# Patient Record
Sex: Female | Born: 1985 | Race: White | Hispanic: No | Marital: Single | Smoking: Current every day smoker
Health system: Southern US, Community
[De-identification: ages and names within clinical notes are randomized; demographics above are authoritative.]

## PROBLEM LIST (undated history)

## (undated) ENCOUNTER — Inpatient Hospital Stay (HOSPITAL_COMMUNITY): Payer: Self-pay

## (undated) DIAGNOSIS — F32A Depression, unspecified: Secondary | ICD-10-CM

## (undated) DIAGNOSIS — D219 Benign neoplasm of connective and other soft tissue, unspecified: Secondary | ICD-10-CM

## (undated) DIAGNOSIS — R12 Heartburn: Secondary | ICD-10-CM

## (undated) DIAGNOSIS — M1990 Unspecified osteoarthritis, unspecified site: Secondary | ICD-10-CM

## (undated) DIAGNOSIS — Z9889 Other specified postprocedural states: Secondary | ICD-10-CM

## (undated) DIAGNOSIS — F329 Major depressive disorder, single episode, unspecified: Secondary | ICD-10-CM

## (undated) DIAGNOSIS — R112 Nausea with vomiting, unspecified: Secondary | ICD-10-CM

## (undated) DIAGNOSIS — O26899 Other specified pregnancy related conditions, unspecified trimester: Secondary | ICD-10-CM

## (undated) HISTORY — PX: WISDOM TOOTH EXTRACTION: SHX21

---
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
## 2005-01-27 ENCOUNTER — Ambulatory Visit: Payer: Self-pay | Admitting: Family Medicine

## 2007-03-08 ENCOUNTER — Emergency Department (HOSPITAL_COMMUNITY): Admission: EM | Admit: 2007-03-08 | Discharge: 2007-03-08 | Payer: Self-pay | Admitting: Emergency Medicine

## 2007-04-02 ENCOUNTER — Emergency Department (HOSPITAL_COMMUNITY): Admission: EM | Admit: 2007-04-02 | Discharge: 2007-04-02 | Payer: Self-pay | Admitting: Emergency Medicine

## 2007-04-10 ENCOUNTER — Encounter: Admission: RE | Admit: 2007-04-10 | Discharge: 2007-04-10 | Payer: Self-pay | Admitting: Emergency Medicine

## 2008-01-23 ENCOUNTER — Inpatient Hospital Stay (HOSPITAL_COMMUNITY): Admission: AD | Admit: 2008-01-23 | Discharge: 2008-01-23 | Payer: Self-pay | Admitting: Obstetrics & Gynecology

## 2008-07-19 ENCOUNTER — Encounter (INDEPENDENT_AMBULATORY_CARE_PROVIDER_SITE_OTHER): Payer: Self-pay | Admitting: Obstetrics and Gynecology

## 2008-07-19 ENCOUNTER — Inpatient Hospital Stay (HOSPITAL_COMMUNITY): Admission: AD | Admit: 2008-07-19 | Discharge: 2008-07-22 | Payer: Self-pay | Admitting: Obstetrics and Gynecology

## 2009-03-09 IMAGING — CR DG CHEST 2V
2 series · 2 of 2 positions shown · non-contrast
Comparison: None

CLINICAL DATA: Chest pain.  
 CHEST - 2 VIEW:

[w chest pa]
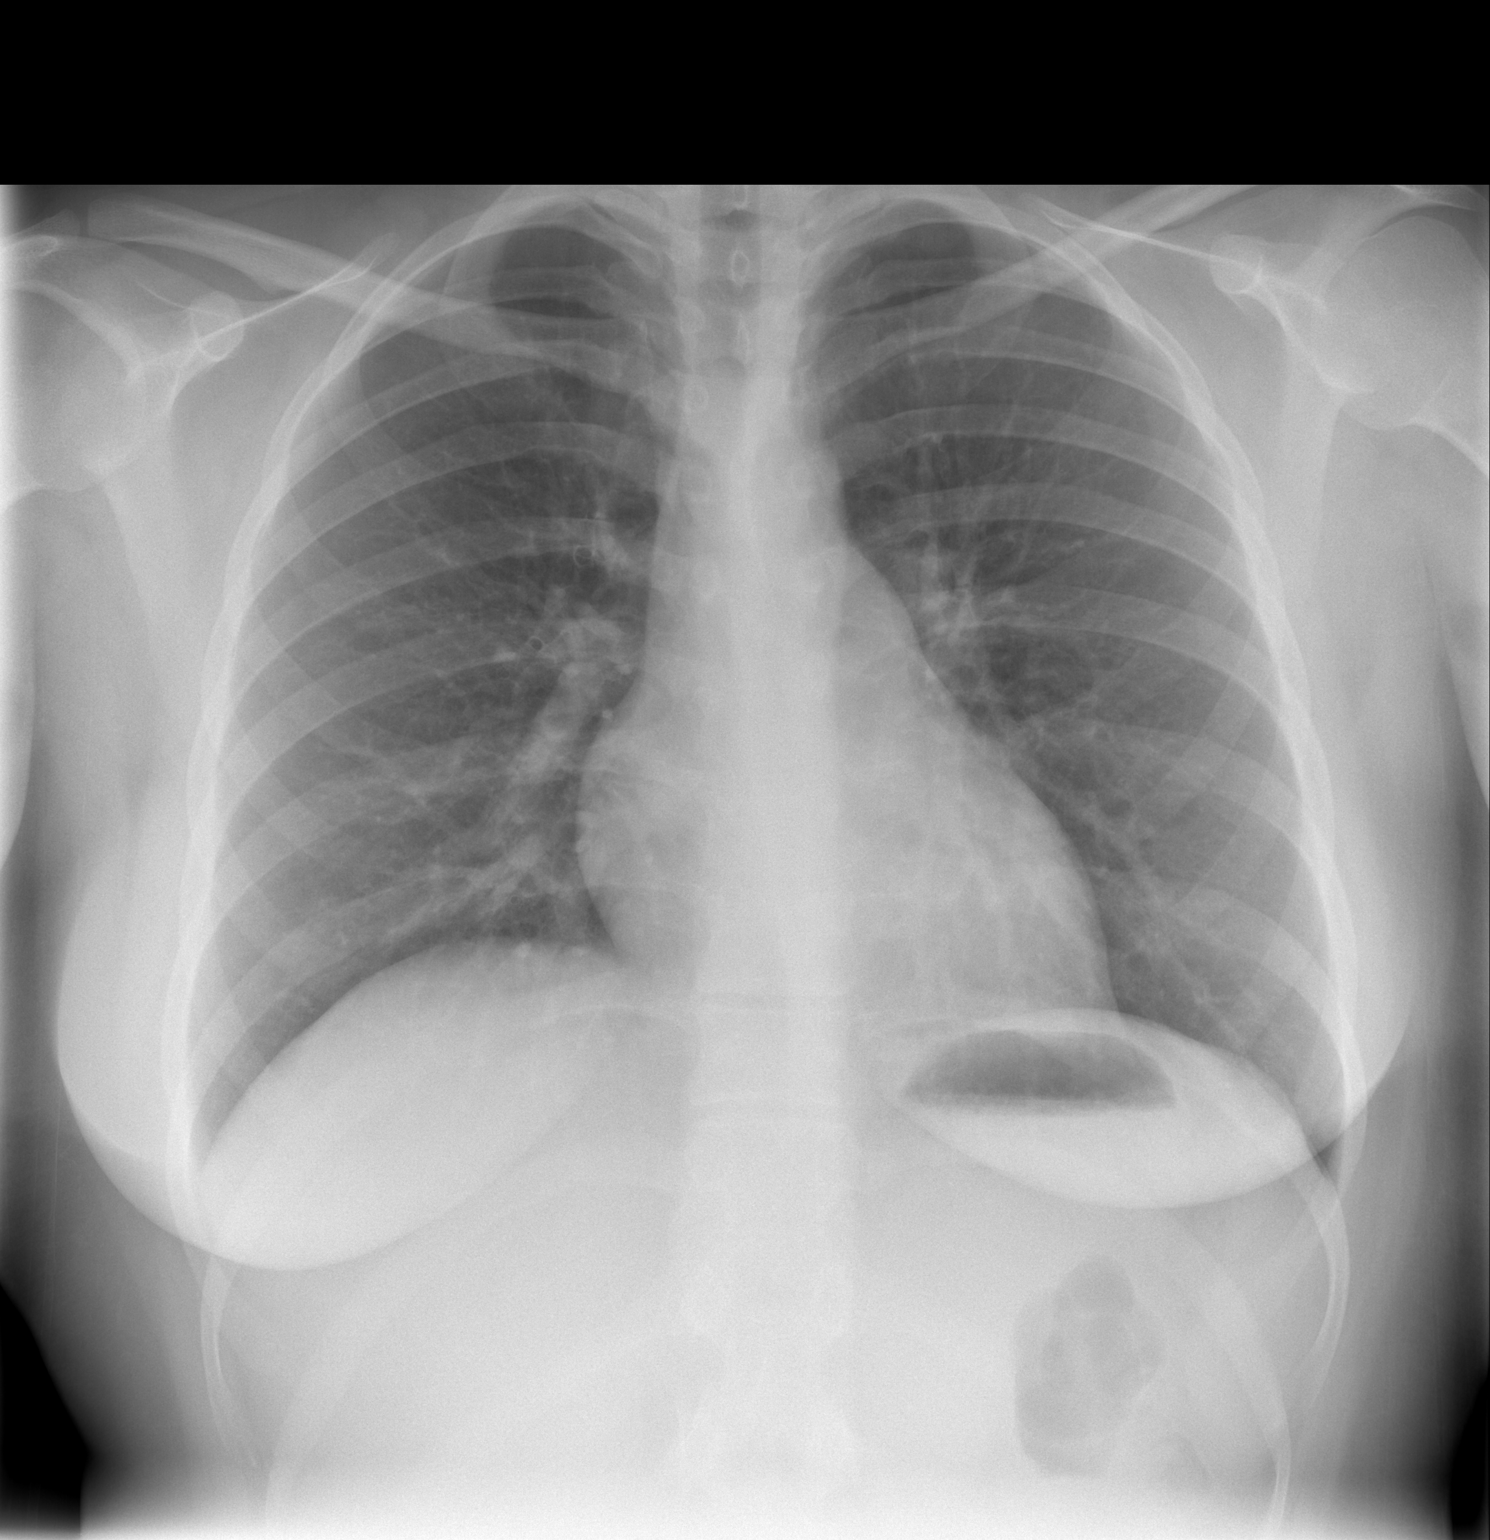

[w chest lat]
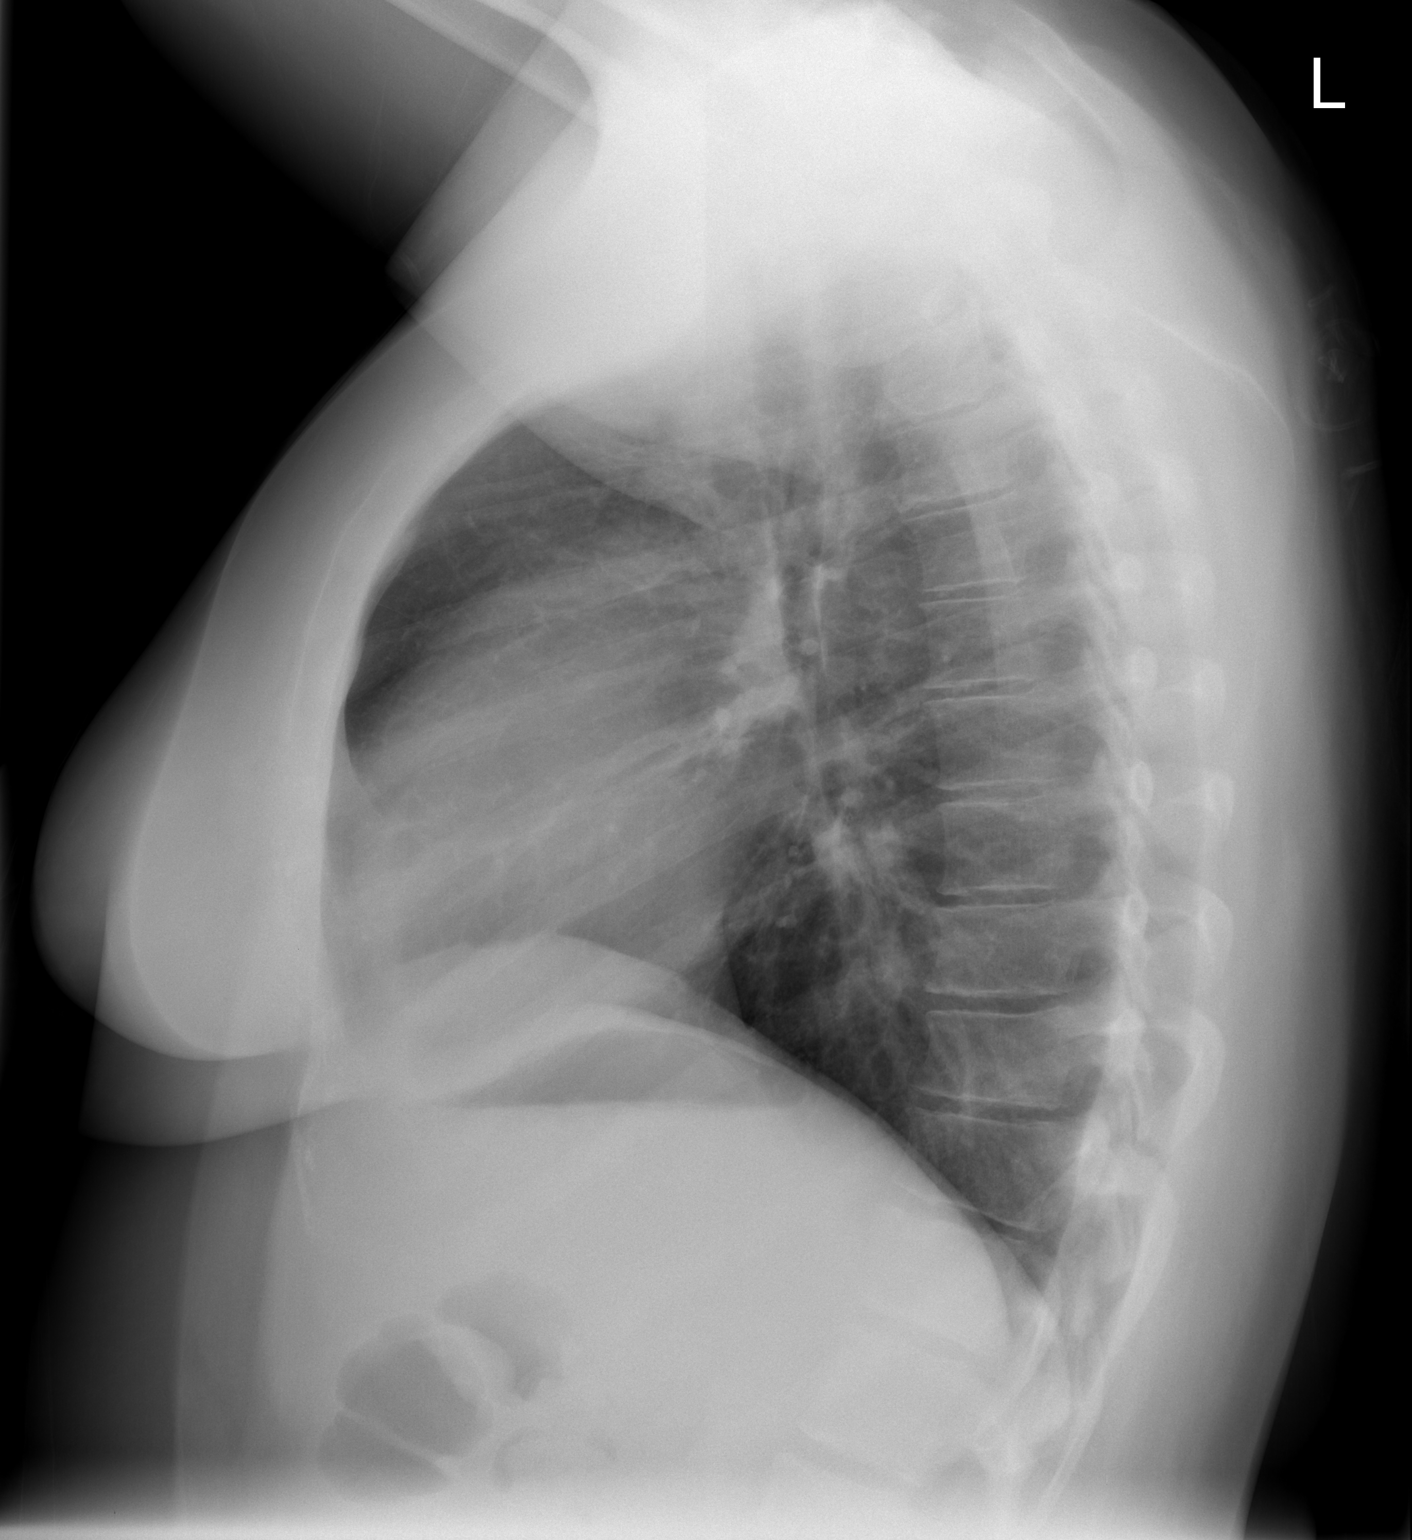

[2 of 2 positions shown; findings below may reference images not displayed]

FINDINGS: The heart size and mediastinal contours are within normal limits.  Both lungs are clear.  The visualized skeletal structures are unremarkable.
IMPRESSION: No active cardiopulmonary disease.

## 2010-04-04 ENCOUNTER — Emergency Department (HOSPITAL_COMMUNITY): Admission: EM | Admit: 2010-04-04 | Discharge: 2010-04-05 | Payer: Self-pay | Admitting: Emergency Medicine

## 2010-10-03 ENCOUNTER — Encounter: Payer: Self-pay | Admitting: Emergency Medicine

## 2011-01-25 NOTE — Op Note (Signed)
[name], Mcmahon            ACCOUNT NO.:  [id number]   MEDICAL RECORD NO.:  [id number]          PATIENT TYPE:  INP   LOCATION:  9119                          FACILITY:  [facility]   PHYSICIAN:  Kendra H. Tenny Craw, MD     DATE OF BIRTH:  05-Jan-1986   DATE OF PROCEDURE:  07/19/2008  DATE OF DISCHARGE:                               OPERATIVE REPORT   PREOPERATIVE DIAGNOSES:  1. A 40 and 6-week intrauterine pregnancy.  2. Failure to progress.  3. Meconium-stained fluid.  4. Deep variables.   POSTOPERATIVE DIAGNOSES:  1. A 40 and 6-week intrauterine pregnancy.  2. Failure to progress.  3. Meconium-stained fluid.  4. Deep variables.   PROCEDURE:  Primary low-transverse cesarean section.   SURGEON:  Freddrick March. Tenny Craw, MD   ASSISTANT:  None.   ANESTHESIA:  Spinal.   OPERATIVE FINDINGS:  Vigorous female infant in the vertex presentation,  weighing 7 pounds 2 ounces with Apgars of 9 at 1 and 9 at 5.  Normal-  appearing ovaries, tubes, and uterus.   SPECIMEN:  Placenta to Pathology.   ESTIMATED BLOOD LOSS:  700 mL.   COMPLICATIONS:  None.   DESCRIPTION OF PROCEDURE:  Lauren Mcmahon is a 25 year old G1, P0 who  presented at 3:00 in the morning on July 19, 2008, complaining of  contractions.  She was found to be 2 cm dilated at that time, 80%  effaced, and -2 station.  She was admitted at 8:30 in the morning.  She  had made little progress and Pitocin was initiated at 10:30.  She was 4  cm dilated, 80% effaced, and -2 station.  Amniotomy was performed.  An  intrauterine pressure catheter was placed at this time.  Thick meconium-  stained fluid was noted at the time of amniotomy.  Fetal heart tones,  however, were reactive in the 130s.  Pitocin augmentation continued;  however, at 5 o'clock, she was still 5 cm dilated and her cervix had  began to become edematous.  Additionally, she was having more consistent  variables with contractions that were becoming more severe to the 60s  lasting for approximately 1-2 minutes with slow return to baseline.  The  decision was made at this time to proceed with primary low-transverse  cesarean section for failure to progress.  Risks, benefits, and  alternatives of the procedure were discussed with the patient.  The  patient consented to the procedure and was brought to the operating  room.  An appropriate surgical level of anesthesia could not be obtained  with her epidural and a spinal was placed.  Surgical anesthesia was  quickly obtained.  She was placed in the dorsal supine position in a  leftward tilt, prepped and draped in the normal sterile fashion.  A  scalpel was used to make a Pfannenstiel skin incision, which was carried  down through the underlying layers of soft tissue to the fascia.  Fascia  was incised in the midline.  Fascial incision was extended laterally  with Mayo scissors.  Superior aspect of the fascial incision was grasped  with Kocher clamps x2 and tented up.  The underlying rectus muscle was  dissected off sharply with the electrocautery unit.  The same procedure  was repeated on the inferior aspect of the fascial incision.  Rectus  muscles were then separated in the midline.  The abdominal peritoneum  was identified, tented up, entered sharply, and the incision was  extended superiorly and inferiorly with good visualization of the  bladder.  Bladder blade was then inserted.  The vesicouterine peritoneum  was identified, tented up, entered sharply, and the incision was  extended laterally with the Metzenbaum scissors and the bladder flap was  created digitally.  Bladder blade was then inserted.  Scalpel was used  to make a low-transverse incision on the uterus, which was extended  laterally with blunt dissection and extended with bandage scissors.  The  infant's head was then identified and brought up to the uterine  incision.  A vacuum was then applied to assist with delivery of the  infant's head and  was required for approximately 3 seconds.  The infant  was then easily delivered and cried vigorously on the operative field.  The mouth was bulb suctioned.  The cord was clamped and cut.  The infant  was passed to the waiting pediatricians and the placenta was then  manually extracted.  Uterus was exteriorized, cleared of all clot and  debris.  Uterine incision was reapproximated with #1 chromic in a  running locked fashion followed by a second imbricating layer.  Uterus,  tubes, and ovaries were inspected and found to be normal.  Uterus was  returned to the abdominal cavity.  Abdominal cavity was cleared of all  clot and debris.  The abdominal peritoneum was then reapproximated with  2-0 Vicryl and the rectus muscle was reapproximated with #1 chromic in a  running fashion and the fascia was closed with 0-looped PDS.  All  sponge, lap, and needle counts were correct x2.  The patient tolerated  the procedure well and was brought to the recovery room in stable  condition following the procedure.      Freddrick March. Tenny Craw, MD  Electronically Signed     KHR/MEDQ  D:  07/19/2008  T:  07/19/2008  Job:  [id number]

## 2011-01-28 NOTE — Discharge Summary (Signed)
Lauren Mcmahon, Lauren Mcmahon            ACCOUNT NO.:  [id number]   MEDICAL RECORD NO.:  [id number]          PATIENT TYPE:  INP   LOCATION:  9135                          FACILITY:  [facility]   PHYSICIAN:  Carrington Clamp, M.D. DATE OF BIRTH:  April 06, 1986   DATE OF ADMISSION:  07/19/2008  DATE OF DISCHARGE:  07/22/2008                               DISCHARGE SUMMARY   FINAL DIAGNOSES:  Intrauterine pregnancy at 50 and 6/7th weeks'  gestation, failure to progress, deep variable decelerations, and  meconium-stained amniotic fluid.   PROCEDURE:  Primary low transverse cesarean section.   SURGEON:  Freddrick March. Tenny Craw, MD   COMPLICATIONS:  None.   This 25 year old G1 P0 presents at 14 and 6/7th weeks gestation in  labor.  The patient's antepartum course had been complicated by known  tobacco dependence.  The patient was counseled on cessation throughout  the pregnancy.  The patient also had a positive Chlamydia culture at the  beginning of pregnancy, repeat was negative and the patient is also  being followed for some low-grade dysplasia on her Pap smear.  The  patient's group B strep culture returned negative at 35 weeks, as well  as her gonorrhea and Chlamydia cultures.  The patient was admitted in  labor at 54 and 6/7th weeks gestation.  The patient was about 2 cm  dilated, 80% effaced at a -2 station.  The patient made little progress  and Pitocin was begun and amniotomy carried out with thick meconium-  stained amniotic fluid noted.  Fetal heart tones were nice and reactive.  The patient did not change her cervix past 5 cm and started to notice  some more consistent variable decelerations with her contractions.  At  this point, a decision was made to proceed with a cesarean section  secondary to this failure to progress and easy variable deceleration.  The patient was taken to the operating room on July 19, 2008, by Dr.  Waynard Reeds where a primary low transverse cesarean section was  performed with the delivery of a 7 pounds 2 ounce female infant with  Apgars of 9 and 9.  Delivery went without complications.  The patient's  postoperative course was benign without any significant fevers.  The  patient was ready for discharge on postoperative day #3.  She was sent  home on a regular diet, told to continue her vitamins, was given  Percocet one to two every 4-6 hours as needed for pain, and was told she  needed her rubella vaccine after discharge.  I do not think there was  rehab anymore at the hospital at this time.  Instructions and  precautions were reviewed with the patient.   LABORATORY ON DISCHARGE:  The patient had a hemoglobin of 10.3, white  blood cell count of 13.5, and platelets of 169,000.      [name], P.A.-C.      Carrington Clamp, M.D.  Electronically Signed    MB/MEDQ  D:  08/20/2008  T:  08/21/2008  Job:  [id number]

## 2011-06-14 LAB — RPR: RPR Ser Ql: NONREACTIVE

## 2011-06-14 LAB — CBC
Hemoglobin: 10.3 — ABNORMAL LOW
MCV: 94.9
Platelets: 237
RBC: 3.11 — ABNORMAL LOW
RBC: 4.53
WBC: 13.5 — ABNORMAL HIGH
WBC: 15.8 — ABNORMAL HIGH

## 2011-06-14 LAB — CCBB MATERNAL DONOR DRAW

## 2011-06-29 LAB — URINALYSIS, ROUTINE W REFLEX MICROSCOPIC
Bilirubin Urine: NEGATIVE
Ketones, ur: NEGATIVE
Nitrite: NEGATIVE
Protein, ur: NEGATIVE
Urobilinogen, UA: 0.2
pH: 8

## 2011-06-29 LAB — BASIC METABOLIC PANEL
CO2: 27
Chloride: 105
Creatinine, Ser: 0.58
GFR calc Af Amer: >60
Glucose, Bld: 105 — ABNORMAL HIGH

## 2011-06-29 LAB — DIFFERENTIAL
Basophils Absolute: 0
Basophils Relative: 0
Eosinophils Absolute: 0
Monocytes Absolute: 0.4
Monocytes Relative: 5
Neutrophils Relative %: 77

## 2011-06-29 LAB — CBC
MCHC: 35.4
MCV: 92.9
RBC: 4.05
RDW: 13.6

## 2011-06-29 LAB — POCT CARDIAC MARKERS: Operator id: 4661

## 2012-02-01 ENCOUNTER — Encounter (HOSPITAL_COMMUNITY): Payer: Self-pay | Admitting: *Deleted

## 2012-02-01 ENCOUNTER — Inpatient Hospital Stay (HOSPITAL_COMMUNITY)
Admission: AD | Admit: 2012-02-01 | Discharge: 2012-02-01 | Disposition: A | Discharge status: Home / Self Care | Payer: Medicaid Other | Source: Ambulatory Visit | Attending: Obstetrics and Gynecology | Admitting: Obstetrics and Gynecology

## 2012-02-01 DIAGNOSIS — O26899 Other specified pregnancy related conditions, unspecified trimester: Secondary | ICD-10-CM

## 2012-02-01 DIAGNOSIS — N949 Unspecified condition associated with female genital organs and menstrual cycle: Secondary | ICD-10-CM | POA: Insufficient documentation

## 2012-02-01 DIAGNOSIS — N898 Other specified noninflammatory disorders of vagina: Secondary | ICD-10-CM

## 2012-02-01 DIAGNOSIS — A499 Bacterial infection, unspecified: Secondary | ICD-10-CM | POA: Insufficient documentation

## 2012-02-01 DIAGNOSIS — O2390 Unspecified genitourinary tract infection in pregnancy, unspecified trimester: Secondary | ICD-10-CM | POA: Insufficient documentation

## 2012-02-01 DIAGNOSIS — N760 Acute vaginitis: Secondary | ICD-10-CM | POA: Insufficient documentation

## 2012-02-01 DIAGNOSIS — B9689 Other specified bacterial agents as the cause of diseases classified elsewhere: Secondary | ICD-10-CM | POA: Insufficient documentation

## 2012-02-01 HISTORY — DX: Benign neoplasm of connective and other soft tissue, unspecified: D21.9

## 2012-02-01 LAB — POCT FERN TEST: Fern Test: NEGATIVE

## 2012-02-01 LAB — WET PREP, GENITAL
Trich, Wet Prep: NONE SEEN
Yeast Wet Prep HPF POC: NONE SEEN

## 2012-02-01 MED ORDER — METRONIDAZOLE 500 MG PO TABS: 500.0000 mg | ORAL_TABLET | Freq: Two times a day (BID) | ORAL | Status: AC

## 2012-02-01 NOTE — MAU Provider Note (Signed)
Chief Complaint:  Vaginal Discharge   First Provider Initiated Contact with Patient 02/01/12 1539      HPI  Lauren Mcmahon is a 26 y.o. G2P1001 at [redacted]w[redacted]d presenting with dampness in her underwear since this morning. She is unsure of the color. Denies contractions or vaginal bleeding. Good fetal movement.   Pregnancy Course: uncomplicated  Past Medical History: Past Medical History  Diagnosis Date  . Fibroid     Past Surgical History: Past Surgical History  Procedure Date  . Cesarean section     Family History: Family History  Problem Relation Age of Onset  . Anesthesia problems Neg Hx     Social History: History  Substance Use Topics  . Smoking status: Current Everyday Smoker -- 0.2 packs/day for 10 years    Types: Cigarettes  . Smokeless tobacco: Not on file  . Alcohol Use: No    Allergies:  Allergies  Allergen Reactions  . Penicillins Nausea Only and Rash    Meds:  Prescriptions prior to admission  Medication Sig Dispense Refill  . acetaminophen (TYLENOL) 500 MG tablet Take 1,000 mg by mouth every 6 (six) hours as needed. For headache/pain      . calcium carbonate (TUMS - DOSED IN MG ELEMENTAL CALCIUM) 500 MG chewable tablet Chew 2 tablets by mouth daily as needed. For heartburn      . Prenatal Vit-Fe Fumarate-FA (PRENATAL MULTIVITAMIN) TABS Take 1 tablet by mouth at bedtime.          Physical Exam  Blood pressure 117/66, pulse 95, temperature 97.7 F (36.5 C), temperature source Oral, resp. rate 20, height 5\' 7"  (1.702 m), weight 79.742 kg (175 lb 12.8 oz). GENERAL: Well-developed, well-nourished female in no acute distress.  HEENT: normocephalic HEART: normal rate RESP: normal effort ABDOMEN: Soft, nontender, nondistended, gravid.  EXTREMITIES: Nontender, no edema NEURO: alert and oriented  SPECULUM EXAM: Moderate amount of thin, white malodorous discharge. Neg pool and fern. No fluid leaking from os.   FHT: 158 [place]: no UC's    Labs: Results for orders placed during the hospital encounter of 02/01/12 (from the past 24 hour(s))  WET PREP, GENITAL     Status: Abnormal   Collection Time   02/01/12  3:50 PM      Component Value Range   Yeast Wet Prep HPF POC NONE SEEN  NONE SEEN    Trich, Wet Prep NONE SEEN  NONE SEEN    Clue Cells Wet Prep HPF POC NONE SEEN  NONE SEEN    WBC, Wet Prep HPF POC FEW (*) NONE SEEN   POCT FERN TEST     Status: Normal   Collection Time   02/01/12  4:05 PM      Component Value Range   Fern Test Negative     Imaging:    Assessment: 1. Vaginal discharge in pregnancy  Clinical Dx of BV  No evidence of SROM  Plan: D/C home per Dr. Ellyn Hack PTL precautions Follow-up Information    Follow up with BOVARD,JODY, MD. (or MAU as needed if symptoms worsen)    Contact information:   [address] [facility] [address] [place] [address] [phone]         Medication List  As of 02/01/2012  9:30 PM   START taking these medications         metroNIDAZOLE 500 MG tablet   Commonly known as: FLAGYL   Take 1 tablet (500 mg total) by mouth 2 (two) times daily.  CONTINUE taking these medications         acetaminophen 500 MG tablet   Commonly known as: TYLENOL      calcium carbonate 500 MG chewable tablet   Commonly known as: TUMS - dosed in mg elemental calcium      prenatal multivitamin Tabs          Where to get your medications    These are the prescriptions that you need to pick up.   You may get these medications from any pharmacy.         metroNIDAZOLE 500 MG tablet            Dorathy Kinsman 02/01/2012 3:41 PM

## 2012-02-01 NOTE — MAU Note (Signed)
Panties wet when woke up this morning.  Called MD, instructed to monitor.  Has changed underwear 3 times, have been wet, but not soaked.

## 2012-05-28 ENCOUNTER — Encounter (HOSPITAL_COMMUNITY): Payer: Self-pay

## 2012-06-04 ENCOUNTER — Encounter (HOSPITAL_COMMUNITY)
Admission: RE | Admit: 2012-06-04 | Discharge: 2012-06-04 | Disposition: A | Discharge status: Home / Self Care | Payer: Medicaid Other | Source: Ambulatory Visit | Attending: Obstetrics and Gynecology | Admitting: Obstetrics and Gynecology

## 2012-06-04 ENCOUNTER — Encounter (HOSPITAL_COMMUNITY): Payer: Self-pay

## 2012-06-04 HISTORY — DX: Heartburn: R12

## 2012-06-04 HISTORY — DX: Major depressive disorder, single episode, unspecified: F32.9

## 2012-06-04 HISTORY — DX: Other specified pregnancy related conditions, unspecified trimester: O26.899

## 2012-06-04 HISTORY — DX: Nausea with vomiting, unspecified: R11.2

## 2012-06-04 HISTORY — DX: Unspecified osteoarthritis, unspecified site: M19.90

## 2012-06-04 HISTORY — DX: Other specified postprocedural states: Z98.890

## 2012-06-04 HISTORY — DX: Depression, unspecified: F32.A

## 2012-06-04 LAB — TYPE AND SCREEN
ABO/RH(D): A POS
Antibody Screen: NEGATIVE

## 2012-06-04 LAB — CBC
MCH: 31.6 pg (ref 26.0–34.0)
MCHC: 33.5 g/dL (ref 30.0–36.0)
Platelets: 207 10*3/uL (ref 150–400)
RDW: 14.3 % (ref 11.5–15.5)

## 2012-06-04 LAB — ABO/RH: ABO/RH(D): A POS

## 2012-06-04 LAB — SURGICAL PCR SCREEN: MRSA, PCR: NEGATIVE

## 2012-06-04 LAB — RPR: RPR Ser Ql: NONREACTIVE

## 2012-06-04 NOTE — Patient Instructions (Addendum)
   Your procedure is scheduled on: Monday, 06/11/12  Enter through the Main Entrance of [facility] at: 6am Pick up the phone at the desk and dial [phone] and inform us of your arrival.  Please call this number if you have any problems the morning of surgery: [phone]  Remember: Do not eat food after midnight: Sunday Do not drink clear liquids after: Sunday Take these medicines the morning of surgery with a SIP OF WATER: None  Do not wear jewelry, make-up, or FINGER nail polish No metal in your hair or on your body. Do not wear lotions, powders, perfumes or deodorant. Do not shave 48 hours prior to surgery. Do not bring valuables to the hospital. Contacts, dentures or bridgework may not be worn into surgery.  Leave suitcase in the car. After Surgery it may be brought to your room. For patients being admitted to the hospital, checkout time is 11:00am the day of discharge. Home with Berline Lopes cell [phone].  Patients discharged on the day of surgery will not be allowed to drive home.     Remember to use your hibiclens as instructed.Please shower with 1/2 bottle the evening before your surgery and the other 1/2 bottle the morning of surgery. Neck down avoiding private area.

## 2012-06-10 ENCOUNTER — Encounter (HOSPITAL_COMMUNITY): Payer: Self-pay | Admitting: Anesthesiology

## 2012-06-10 NOTE — Anesthesia Preprocedure Evaluation (Addendum)
Anesthesia Evaluation  Patient identified by MRN, date of birth, ID band Patient awake    Reviewed: Allergy & Precautions, H&P , NPO status , Patient's Chart, lab work & pertinent test results  History of Anesthesia Complications (+) PONV  Airway Mallampati: III TM Distance: >3 FB Neck ROM: Full    Dental No notable dental hx. (+) Teeth Intact   Pulmonary Current Smoker,  breath sounds clear to auscultation  Pulmonary exam normal       Cardiovascular negative cardio ROS  Rhythm:Regular Rate:Normal     Neuro/Psych Depression negative neurological ROS     GI/Hepatic Neg liver ROS, GERD-  Medicated and Controlled,  Endo/Other  negative endocrine ROS  Renal/GU negative Renal ROS  negative genitourinary   Musculoskeletal  (+) Arthritis -,   Abdominal (+) + obese,   Peds  Hematology negative hematology ROS (+)   Anesthesia Other Findings   Reproductive/Obstetrics (+) Pregnancy                          Anesthesia Physical Anesthesia Plan  ASA: II  Anesthesia Plan: Spinal   Post-op Pain Management:    Induction: Intravenous  Airway Management Planned: Natural Airway  Additional Equipment:   Intra-op Plan:   Post-operative Plan:   Informed Consent: I have reviewed the patients History and Physical, chart, labs and discussed the procedure including the risks, benefits and alternatives for the proposed anesthesia with the patient or authorized representative who has indicated his/her understanding and acceptance.   Dental advisory given  Plan Discussed with: CRNA, Anesthesiologist and Surgeon  Anesthesia Plan Comments:         Anesthesia Quick Evaluation

## 2012-06-10 NOTE — H&P (Signed)
Lauren Mcmahon is a 26 y.o. female G2P1001 at 39+ weeks (EDD 06/17/12 by LMP c/w 10 week [place]) presenting for scheduled repeat C/S.  Prenatal care uncomplicated except for the prior C-section.  She was noted to have a low lying placenta on her 18 week scan, but this resolved on f/u. SHe is a smoker, but quit with pregnancy.  She is rubella non-immune.  History OB History    Grav Para Term Preterm Abortions TAB SAB Ect Mult Living   2 1 1  0 0 0 0 0 0 1    2009 LTCS for FTP and fetal decels 7#2oz  Past Medical History  Diagnosis Date  . Fibroid   . PONV (postoperative nausea and vomiting)   . Depression     hx - no meds  . Heartburn in pregnancy     tx with tums  . Arthritis     deg disk disease - lower back   Past Surgical History  Procedure Date  . Cesarean section 2009    x 1  . Wisdom tooth extraction    Family History: family history is negative for Anesthesia problems. Social History:  reports that she has been smoking Cigarettes.  She has a 2.5 pack-year smoking history. She has never used smokeless tobacco. She reports that she does not drink alcohol or use illicit drugs.   Prenatal Transfer Tool  Maternal Diabetes: No Genetic Screening: Normal Maternal Ultrasounds/Referrals: Normal Fetal Ultrasounds or other Referrals:  None Maternal Substance Abuse:  Yes:  Type: Smoker--quit inpregnancy Significant Maternal Medications:  None Significant Maternal Lab Results:  None Other Comments:  None  ROS    There were no vitals taken for this visit. Maternal Exam:  Uterine Assessment: Contraction strength is mild.  Contraction frequency is rare.   Abdomen: Patient reports no abdominal tenderness. Introitus: Normal vulva. Normal vagina.    Physical Exam  Constitutional: She is oriented to person, place, and time. She appears well-developed and well-nourished.  Cardiovascular: Normal rate and regular rhythm.   Respiratory: Effort normal and breath sounds normal.  GI:  Soft.  Genitourinary: Vagina normal and uterus normal.  Neurological: She is alert and oriented to person, place, and time.  Psychiatric: She has a normal mood and affect. Her behavior is normal.    Prenatal labs: ABO, Rh: --/--/A POS (09/23 1105) Antibody: NEG (09/23 1100) Rubella: Nonimmune (03/15 0000) RPR: NON REACTIVE (09/23 1059)  HBsAg: Negative (03/15 0000)  HIV: Non-reactive (03/15 0000)  GBS:   negative One Hour GTT 103 First trimester screen and AFP WNL Assessment/Plan: Pt counseled for repeat c-section as declines TOL.  We discussed risk of bleeding, infection and possible damage to bowel and bladder.  She desires to proceed.  Oliver Pila 06/10/2012, 7:09 PM

## 2012-06-11 ENCOUNTER — Encounter (HOSPITAL_COMMUNITY): Payer: Self-pay | Admitting: Anesthesiology

## 2012-06-11 ENCOUNTER — Inpatient Hospital Stay (HOSPITAL_COMMUNITY): Payer: Medicaid Other | Admitting: Anesthesiology

## 2012-06-11 ENCOUNTER — Inpatient Hospital Stay (HOSPITAL_COMMUNITY)
Admission: AD | Admit: 2012-06-11 | Discharge: 2012-06-13 | DRG: 766 | Disposition: A | Discharge status: Home / Self Care | Payer: Medicaid Other | Source: Ambulatory Visit | Attending: Obstetrics and Gynecology | Admitting: Obstetrics and Gynecology

## 2012-06-11 ENCOUNTER — Encounter (HOSPITAL_COMMUNITY): Payer: Self-pay | Admitting: *Deleted

## 2012-06-11 ENCOUNTER — Encounter (HOSPITAL_COMMUNITY)
Admission: AD | Disposition: A | Discharge status: Home / Self Care | Payer: Self-pay | Source: Ambulatory Visit | Attending: Obstetrics and Gynecology

## 2012-06-11 DIAGNOSIS — Z98891 History of uterine scar from previous surgery: Secondary | ICD-10-CM

## 2012-06-11 DIAGNOSIS — O34219 Maternal care for unspecified type scar from previous cesarean delivery: Principal | ICD-10-CM | POA: Diagnosis present

## 2012-06-11 LAB — TYPE AND SCREEN
ABO/RH(D): A POS
Antibody Screen: NEGATIVE

## 2012-06-11 SURGERY — Surgical Case
Anesthesia: Spinal | Site: Abdomen | Wound class: Clean Contaminated

## 2012-06-11 MED ORDER — SCOPOLAMINE 1 MG/3DAYS TD PT72
MEDICATED_PATCH | TRANSDERMAL | Status: AC
  Administered 2012-06-11: 1.5 mg via TRANSDERMAL
  Filled 2012-06-11: qty 1

## 2012-06-11 MED ORDER — SODIUM CHLORIDE 0.9 % IJ SOLN: 3.0000 mL | INTRAMUSCULAR | Status: DC | PRN

## 2012-06-11 MED ORDER — ONDANSETRON HCL 4 MG/2ML IJ SOLN: 4.0000 mg | Freq: Three times a day (TID) | INTRAMUSCULAR | Status: DC | PRN

## 2012-06-11 MED ORDER — KETOROLAC TROMETHAMINE 30 MG/ML IJ SOLN: 30.0000 mg | Freq: Four times a day (QID) | INTRAMUSCULAR | Status: AC | PRN

## 2012-06-11 MED ORDER — ONDANSETRON HCL 4 MG/2ML IJ SOLN: 4.0000 mg | INTRAMUSCULAR | Status: DC | PRN

## 2012-06-11 MED ORDER — KETOROLAC TROMETHAMINE 30 MG/ML IJ SOLN
INTRAMUSCULAR | Status: AC
  Administered 2012-06-11: 30 mg via INTRAVENOUS
  Filled 2012-06-11: qty 1

## 2012-06-11 MED ORDER — ZOLPIDEM TARTRATE 5 MG PO TABS: 5.0000 mg | ORAL_TABLET | Freq: Every evening | ORAL | Status: DC | PRN

## 2012-06-11 MED ORDER — NALOXONE HCL 0.4 MG/ML IJ SOLN: 0.4000 mg | INTRAMUSCULAR | Status: DC | PRN

## 2012-06-11 MED ORDER — LACTATED RINGERS IV SOLN
INTRAVENOUS | Status: DC
  Administered 2012-06-11: 07:00:00 via INTRAVENOUS

## 2012-06-11 MED ORDER — SENNOSIDES-DOCUSATE SODIUM 8.6-50 MG PO TABS
2.0000 | ORAL_TABLET | Freq: Every day | ORAL | Status: DC
  Administered 2012-06-11 – 2012-06-12 (×2): 2 via ORAL

## 2012-06-11 MED ORDER — SCOPOLAMINE 1 MG/3DAYS TD PT72
1.0000 | MEDICATED_PATCH | Freq: Once | TRANSDERMAL | Status: DC
  Filled 2012-06-11: qty 1

## 2012-06-11 MED ORDER — DIPHENHYDRAMINE HCL 50 MG/ML IJ SOLN: 25.0000 mg | INTRAMUSCULAR | Status: DC | PRN

## 2012-06-11 MED ORDER — MENTHOL 3 MG MT LOZG: 1.0000 | LOZENGE | OROMUCOSAL | Status: DC | PRN

## 2012-06-11 MED ORDER — NALBUPHINE SYRINGE 5 MG/0.5 ML
5.0000 mg | INJECTION | INTRAMUSCULAR | Status: DC | PRN
  Filled 2012-06-11 (×2): qty 1

## 2012-06-11 MED ORDER — PHENYLEPHRINE HCL 10 MG/ML IJ SOLN
INTRAMUSCULAR | Status: DC | PRN
  Administered 2012-06-11 (×9): 40 ug via INTRAVENOUS

## 2012-06-11 MED ORDER — SIMETHICONE 80 MG PO CHEW: 80.0000 mg | CHEWABLE_TABLET | ORAL | Status: DC | PRN

## 2012-06-11 MED ORDER — IBUPROFEN 600 MG PO TABS
600.0000 mg | ORAL_TABLET | Freq: Four times a day (QID) | ORAL | Status: DC
  Administered 2012-06-11 – 2012-06-13 (×7): 600 mg via ORAL
  Filled 2012-06-11 (×2): qty 1

## 2012-06-11 MED ORDER — OXYCODONE-ACETAMINOPHEN 5-325 MG PO TABS
1.0000 | ORAL_TABLET | ORAL | Status: DC | PRN
  Administered 2012-06-11 (×2): 1 via ORAL
  Administered 2012-06-12 – 2012-06-13 (×8): 2 via ORAL
  Filled 2012-06-11: qty 1
  Filled 2012-06-11 (×2): qty 2
  Filled 2012-06-11: qty 1
  Filled 2012-06-11 (×6): qty 2

## 2012-06-11 MED ORDER — FENTANYL CITRATE 0.05 MG/ML IJ SOLN
INTRAMUSCULAR | Status: AC
  Filled 2012-06-11: qty 2

## 2012-06-11 MED ORDER — DIPHENHYDRAMINE HCL 25 MG PO CAPS: 25.0000 mg | ORAL_CAPSULE | Freq: Four times a day (QID) | ORAL | Status: DC | PRN

## 2012-06-11 MED ORDER — METOCLOPRAMIDE HCL 5 MG/ML IJ SOLN: 10.0000 mg | Freq: Three times a day (TID) | INTRAMUSCULAR | Status: DC | PRN

## 2012-06-11 MED ORDER — NALBUPHINE SYRINGE 5 MG/0.5 ML
5.0000 mg | INJECTION | INTRAMUSCULAR | Status: DC | PRN
  Administered 2012-06-11: 10 mg via SUBCUTANEOUS
  Filled 2012-06-11: qty 1

## 2012-06-11 MED ORDER — DIBUCAINE 1 % RE OINT: 1.0000 "application " | TOPICAL_OINTMENT | RECTAL | Status: DC | PRN

## 2012-06-11 MED ORDER — MORPHINE SULFATE 0.5 MG/ML IJ SOLN
INTRAMUSCULAR | Status: AC
  Filled 2012-06-11: qty 10

## 2012-06-11 MED ORDER — LACTATED RINGERS IV SOLN
INTRAVENOUS | Status: DC | PRN
  Administered 2012-06-11: 08:00:00 via INTRAVENOUS

## 2012-06-11 MED ORDER — DIPHENHYDRAMINE HCL 25 MG PO CAPS: 25.0000 mg | ORAL_CAPSULE | ORAL | Status: DC | PRN

## 2012-06-11 MED ORDER — EPHEDRINE 5 MG/ML INJ
INTRAVENOUS | Status: AC
  Filled 2012-06-11: qty 10

## 2012-06-11 MED ORDER — FENTANYL CITRATE 0.05 MG/ML IJ SOLN
INTRAMUSCULAR | Status: AC
  Administered 2012-06-11: 50 ug via INTRAVENOUS
  Filled 2012-06-11: qty 2

## 2012-06-11 MED ORDER — OXYTOCIN 10 UNIT/ML IJ SOLN
40.0000 [IU] | INTRAVENOUS | Status: DC | PRN
  Administered 2012-06-11: 40 [IU] via INTRAVENOUS

## 2012-06-11 MED ORDER — WITCH HAZEL-GLYCERIN EX PADS: 1.0000 "application " | MEDICATED_PAD | CUTANEOUS | Status: DC | PRN

## 2012-06-11 MED ORDER — LANOLIN HYDROUS EX OINT: 1.0000 "application " | TOPICAL_OINTMENT | CUTANEOUS | Status: DC | PRN

## 2012-06-11 MED ORDER — SIMETHICONE 80 MG PO CHEW
80.0000 mg | CHEWABLE_TABLET | Freq: Three times a day (TID) | ORAL | Status: DC
  Administered 2012-06-11 – 2012-06-12 (×2): 80 mg via ORAL
  Administered 2012-06-12: 160 mg via ORAL
  Administered 2012-06-12 – 2012-06-13 (×3): 80 mg via ORAL

## 2012-06-11 MED ORDER — DIPHENHYDRAMINE HCL 50 MG/ML IJ SOLN: 12.5000 mg | INTRAMUSCULAR | Status: DC | PRN

## 2012-06-11 MED ORDER — KETOROLAC TROMETHAMINE 30 MG/ML IJ SOLN
30.0000 mg | Freq: Four times a day (QID) | INTRAMUSCULAR | Status: AC | PRN
  Administered 2012-06-11 (×2): 30 mg via INTRAVENOUS
  Filled 2012-06-11: qty 1

## 2012-06-11 MED ORDER — GENTAMICIN SULFATE 40 MG/ML IJ SOLN
INTRAMUSCULAR | Status: AC
  Administered 2012-06-11: 100 mL via INTRAVENOUS
  Filled 2012-06-11: qty 9

## 2012-06-11 MED ORDER — IBUPROFEN 600 MG PO TABS
600.0000 mg | ORAL_TABLET | Freq: Four times a day (QID) | ORAL | Status: DC | PRN
  Filled 2012-06-11 (×5): qty 1

## 2012-06-11 MED ORDER — TETANUS-DIPHTH-ACELL PERTUSSIS 5-2.5-18.5 LF-MCG/0.5 IM SUSP: 0.5000 mL | Freq: Once | INTRAMUSCULAR | Status: DC

## 2012-06-11 MED ORDER — ONDANSETRON HCL 4 MG/2ML IJ SOLN
INTRAMUSCULAR | Status: AC
  Filled 2012-06-11: qty 2

## 2012-06-11 MED ORDER — PHENYLEPHRINE 40 MCG/ML (10ML) SYRINGE FOR IV PUSH (FOR BLOOD PRESSURE SUPPORT)
PREFILLED_SYRINGE | INTRAVENOUS | Status: AC
  Filled 2012-06-11: qty 10

## 2012-06-11 MED ORDER — FENTANYL CITRATE 0.05 MG/ML IJ SOLN
25.0000 ug | INTRAMUSCULAR | Status: DC | PRN
  Administered 2012-06-11: 50 ug via INTRAVENOUS

## 2012-06-11 MED ORDER — SODIUM CHLORIDE 0.9 % IR SOLN
Status: DC | PRN
  Administered 2012-06-11: 1000 mL

## 2012-06-11 MED ORDER — OXYTOCIN 10 UNIT/ML IJ SOLN
INTRAMUSCULAR | Status: AC
  Filled 2012-06-11: qty 4

## 2012-06-11 MED ORDER — ONDANSETRON HCL 4 MG PO TABS: 4.0000 mg | ORAL_TABLET | ORAL | Status: DC | PRN

## 2012-06-11 MED ORDER — LACTATED RINGERS IV SOLN: INTRAVENOUS | Status: DC

## 2012-06-11 MED ORDER — ONDANSETRON HCL 4 MG/2ML IJ SOLN
INTRAMUSCULAR | Status: DC | PRN
  Administered 2012-06-11: 4 mg via INTRAVENOUS

## 2012-06-11 MED ORDER — SODIUM CHLORIDE 0.9 % IV SOLN
1.0000 ug/kg/h | INTRAVENOUS | Status: DC | PRN
  Filled 2012-06-11: qty 2.5

## 2012-06-11 MED ORDER — OXYTOCIN 40 UNITS IN LACTATED RINGERS INFUSION - SIMPLE MED
62.5000 mL/h | INTRAVENOUS | Status: AC
  Administered 2012-06-11: 62.5 mL/h via INTRAVENOUS
  Filled 2012-06-11: qty 1000

## 2012-06-11 MED ORDER — PRENATAL MULTIVITAMIN CH
1.0000 | ORAL_TABLET | Freq: Every day | ORAL | Status: DC
  Administered 2012-06-12 – 2012-06-13 (×2): 1 via ORAL
  Filled 2012-06-11 (×2): qty 1

## 2012-06-11 MED ORDER — SCOPOLAMINE 1 MG/3DAYS TD PT72
1.0000 | MEDICATED_PATCH | Freq: Once | TRANSDERMAL | Status: DC
  Administered 2012-06-11: 1.5 mg via TRANSDERMAL

## 2012-06-11 MED ORDER — MEPERIDINE HCL 25 MG/ML IJ SOLN: 6.2500 mg | INTRAMUSCULAR | Status: DC | PRN

## 2012-06-11 MED ORDER — LACTATED RINGERS IV SOLN
INTRAVENOUS | Status: DC
  Administered 2012-06-11 (×3): via INTRAVENOUS

## 2012-06-11 MED ORDER — EPHEDRINE SULFATE 50 MG/ML IJ SOLN
INTRAMUSCULAR | Status: DC | PRN
  Administered 2012-06-11 (×3): 10 mg via INTRAVENOUS
  Administered 2012-06-11: 5 mg via INTRAVENOUS
  Administered 2012-06-11: 10 mg via INTRAVENOUS

## 2012-06-11 SURGICAL SUPPLY — 37 items
APL SKNCLS STERI-STRIP NONHPOA (GAUZE/BANDAGES/DRESSINGS) ×1
BENZOIN TINCTURE PRP APPL 2/3 (GAUZE/BANDAGES/DRESSINGS) ×2 IMPLANT
CLOTH BEACON ORANGE TIMEOUT ST (SAFETY) ×2 IMPLANT
CONTAINER PREFILL 10% NBF 15ML (MISCELLANEOUS) IMPLANT
DRAPE SURG 17X23 STRL (DRAPES) ×2 IMPLANT
DRESSING TELFA 8X3 (GAUZE/BANDAGES/DRESSINGS) ×2 IMPLANT
DRSG COVADERM 4X10 (GAUZE/BANDAGES/DRESSINGS) ×4 IMPLANT
DURAPREP 26ML APPLICATOR (WOUND CARE) ×2 IMPLANT
ELECT REM PT RETURN 9FT ADLT (ELECTROSURGICAL) ×2
ELECTRODE REM PT RTRN 9FT ADLT (ELECTROSURGICAL) ×1 IMPLANT
EXTRACTOR VACUUM KIWI (MISCELLANEOUS) IMPLANT
EXTRACTOR VACUUM M CUP 4 TUBE (SUCTIONS) IMPLANT
GLOVE BIO SURGEON STRL SZ 6.5 (GLOVE) ×4 IMPLANT
GLOVE BIO SURGEON STRL SZ8 (GLOVE) ×2 IMPLANT
GOWN PREVENTION PLUS LG XLONG (DISPOSABLE) ×4 IMPLANT
KIT ABG SYR 3ML LUER SLIP (SYRINGE) IMPLANT
NEEDLE HYPO 25X5/8 SAFETYGLIDE (NEEDLE) IMPLANT
NS IRRIG 1000ML POUR BTL (IV SOLUTION) ×2 IMPLANT
PACK C SECTION WH (CUSTOM PROCEDURE TRAY) ×2 IMPLANT
PAD ABD 7.5X8 STRL (GAUZE/BANDAGES/DRESSINGS) ×2 IMPLANT
PAD OB MATERNITY 4.3X12.25 (PERSONAL CARE ITEMS) ×2 IMPLANT
RTRCTR C-SECT PINK 25CM LRG (MISCELLANEOUS) ×2 IMPLANT
SLEEVE SCD COMPRESS KNEE MED (MISCELLANEOUS) IMPLANT
STAPLER VISISTAT 35W (STAPLE) IMPLANT
STRIP CLOSURE SKIN 1/2X4 (GAUZE/BANDAGES/DRESSINGS) ×2 IMPLANT
SUT CHROMIC 1 CTX 36 (SUTURE) ×6 IMPLANT
SUT PLAIN 0 NONE (SUTURE) IMPLANT
SUT PLAIN 2 0 XLH (SUTURE) ×2 IMPLANT
SUT VIC AB 0 CT1 27 (SUTURE) ×6
SUT VIC AB 0 CT1 27XBRD ANBCTR (SUTURE) ×3 IMPLANT
SUT VIC AB 2-0 CT1 27 (SUTURE)
SUT VIC AB 2-0 CT1 TAPERPNT 27 (SUTURE) IMPLANT
SUT VIC AB 4-0 KS 27 (SUTURE) ×2 IMPLANT
TAPE CLOTH SURG 4X10 WHT LF (GAUZE/BANDAGES/DRESSINGS) ×2 IMPLANT
TOWEL OR 17X24 6PK STRL BLUE (TOWEL DISPOSABLE) ×4 IMPLANT
TRAY FOLEY CATH 14FR (SET/KITS/TRAYS/PACK) ×2 IMPLANT
WATER STERILE IRR 1000ML POUR (IV SOLUTION) ×2 IMPLANT

## 2012-06-11 NOTE — OR Nursing (Addendum)
Uterus massaged by S. Tristram Milian RN. Two tubes of cord blood sent to lab.  15cc of blood evacuated from uterus during uterine massage. 

## 2012-06-11 NOTE — Transfer of Care (Signed)
Immediate Anesthesia Transfer of Care Note  Patient: Lauren Mcmahon  Procedure(s) Performed: Procedure(s) (LRB) with comments: CESAREAN SECTION (N/A) - Repeat cesarean section with delivery of baby girl at 0803. Apgars 8/9.  Patient Location: PACU  Anesthesia Type: Spinal  Level of Consciousness: awake, alert  and oriented  Airway & Oxygen Therapy: Patient Spontanous Breathing  Post-op Assessment: Report given to PACU RN and Post -op Vital signs reviewed and stable  Post vital signs: Reviewed and stable  Complications: No apparent anesthesia complications

## 2012-06-11 NOTE — Anesthesia Procedure Notes (Signed)

## 2012-06-11 NOTE — Anesthesia Postprocedure Evaluation (Signed)
  Anesthesia Post-op Note  Patient: Technical brewer  Procedure(s) Performed: Procedure(s) (LRB) with comments: CESAREAN SECTION (N/A) - Repeat cesarean section with delivery of baby girl at 0803. Apgars 8/9.   Patient is awake, responsive, moving her legs, and has signs of resolution of her numbness. Pain and nausea are reasonably well controlled. Vital signs are stable and clinically acceptable. Oxygen saturation is clinically acceptable. There are no apparent anesthetic complications at this time. Patient is ready for discharge.

## 2012-06-11 NOTE — Anesthesia Postprocedure Evaluation (Signed)
  Anesthesia Post-op Note  Patient: Technical brewer  Procedure(s) Performed: Procedure(s) (LRB) with comments: CESAREAN SECTION (N/A) - Repeat cesarean section with delivery of baby girl at 0803. Apgars 8/9.  Patient Location: Mother/Baby  Anesthesia Type: spinal  Level of Consciousness: awake, alert  and oriented  Airway and Oxygen Therapy: Patient Spontanous Breathing  Post-op Pain: none  Post-op Assessment: Post-op Vital signs reviewed and Patient's Cardiovascular Status Stable  Post-op Vital Signs: Reviewed and stable  Complications: No apparent anesthesia complications

## 2012-06-11 NOTE — Brief Op Note (Signed)
06/11/2012  8:54 AM  PATIENT:  Lauren Mcmahon  26 y.o. female  PRE-OPERATIVE DIAGNOSIS:  previous cesarean section /declines trial of labor  POST-OPERATIVE DIAGNOSIS:  previous cesarean section /declines trial of labor  PROCEDURE:  Procedure(s) (LRB) with comments: CESAREAN SECTION (N/A) - Repeat cesarean section with delivery of baby girl at 45. Apgars 8/9.  SURGEON:  Surgeon(s) and Role:    Bing Plume, MD - Assisting    * Oliver Pila, MD - Primary   ANESTHESIA:   spinal  EBL:  Total I/O In: 2000 [I.V.:2000] Out: 1050 [Urine:250; Blood:800]  BLOOD ADMINISTERED:none  DRAINS: Urinary Catheter (Foley)   LOCAL MEDICATIONS USED:  NONE  SPECIMEN:  Placenta to L&D  COUNTS:  YES  TOURNIQUET:  * No tourniquets in log *  DICTATION: .Dragon Dictation  PLAN OF CARE: Admit to inpatient   PATIENT DISPOSITION:  PACU - hemodynamically stable.

## 2012-06-11 NOTE — Progress Notes (Signed)
Patient ID: Lauren Mcmahon, female   DOB: 11-20-1985, 26 y.o.   MRN: [id number] Per pt no changes in dictated H&P.  Brief exam WNL.  Ready to proceed.

## 2012-06-11 NOTE — Op Note (Signed)
Operative note  Preoperative diagnosis Term pregnancy at 39 weeks Prior C-section declines trial of labor  Postoperative diagnosis Same  Procedure Repeat low transverse cesarean section with 2 layer closure of uterus  Surgeon Dr. Huel Cote Dr. Tracey Harries  Anesthesia Spinal  Fluids Estimated blood loss 800 cc Urine output 250 cc clear urine IV fluids LR 2500 cc  Findings A viable female infant in the vertex presentation Apgars were 8 and 9 weight was pending at time of dictation. Patient had normal pelvic anatomy.  Specimen Placenta sent to L&D  Procedure After informed consent was obtained patient was taken to the operating room where spinal anesthesia was obtained by Dr. Cristela Blue without difficulty. An appropriate time out was performed and the patient was prepped and draped in the normal sterile fashion in the dorsal supine position with a leftward tilt. An incision was made through pre-existing Pfannenstiel scar and carried down to the underlying layer of fascia by sharp dissection and Bovie cautery. The fascia was nicked in the midline and the incision was extended laterally with Mayo scissors. The inferior aspect of the incision was grasped elevated and dissected off the underlying rectus muscles. The superior aspect was likewise dissected off the rectus muscles. The peritoneal cavity was entered sharply and the incision extended both superiorly and inferiorly with careful attention to avoid both bowel and bladder. The Alexis self-retaining wound retractor was then placed within the incision and the lower uterine segment exposed nicely. The lower uterine segment was then incised in a transverse fashion and the cavity itself entered bluntly clear fluid was noted. The infant's head was then delivered up to the level of the incision and could not quite be delivered with fundal pressure therefore the vacuum was applied and with minimal traction the vertex easily lifted  through the incision. The nose and mouth were bulb suctioned and the remainder of the infant delivered without difficulty. The cord was clamped and cut and infant handed to the waiting pediatricians. The placenta was then attempted to be spontaneously delivered was slightly adherent on the posterior right surface of the uterus requiring some manual manipulation but did appear to deliver intact. The uterus was then cleared of all clots and debris with a moist lap sponge. The uterine incision was then closed in 2 layers the first a running locked layer 1-0 chromic and the second an imbricating layer of the same suture. One area of bleeding on the right lower aspect of the incision was controlled with a figure-of-eight suture of chromic. The tubes and ovaries were then inspected and found to be normal and the uterine incision was once again inspected and found to be hemostatic. Therefore the Alexis retractor and all instruments and sponges were removed from the abdomen. The rectus muscles and peritoneum were reapproximated with several interrupted mattress sutures of 2-0 Vicryl. The fascia was closed with 0 Vicryl in a running fashion. The subcutaneous tissue was reapproximated with 2-0 plain in a running fashion. The skin was closed with a subcuticular stitch of 4-0 Vicryl on a Keith needle and reinforced with benzoin and Steri-Strips. At the conclusion of the procedure all instruments and sponge counts were correct and the patient was doing well and taken to the recovery room with her baby in good condition.

## 2012-06-12 ENCOUNTER — Encounter (HOSPITAL_COMMUNITY): Payer: Self-pay | Admitting: Obstetrics and Gynecology

## 2012-06-12 LAB — CBC
HCT: 30.7 % — ABNORMAL LOW (ref 36.0–46.0)
Hemoglobin: 10.7 g/dL — ABNORMAL LOW (ref 12.0–15.0)
MCH: 32.5 pg (ref 26.0–34.0)
RBC: 3.29 MIL/uL — ABNORMAL LOW (ref 3.87–5.11)

## 2012-06-12 NOTE — Progress Notes (Signed)
UR chart review completed.  

## 2012-06-12 NOTE — Progress Notes (Signed)
Post Partum Day 1 Repeat C-section Subjective: no complaints, up ad lib and tolerating PO  Objective: Blood pressure 115/72, pulse 57, temperature 97.4 F (36.3 C), temperature source Oral, resp. rate 18, weight 92.534 kg (204 lb), SpO2 99.00%, unknown if currently breastfeeding.  Physical Exam:  General: alert and cooperative Lochia: appropriate Uterine Fundus: firm Incision: healing well   Basename 06/12/12 0535  HGB 10.7*  HCT 30.7*    Assessment/Plan: Plan for discharge tomorrow if pt feeling well.   LOS: 1 day   Javier Mamone W 06/12/2012, 8:35 AM

## 2012-06-13 MED ORDER — IBUPROFEN 600 MG PO TABS: 600.0000 mg | ORAL_TABLET | Freq: Four times a day (QID) | ORAL | Status: DC | PRN

## 2012-06-13 MED ORDER — MEASLES, MUMPS & RUBELLA VAC ~~LOC~~ INJ
0.5000 mL | INJECTION | Freq: Once | SUBCUTANEOUS | Status: AC
  Administered 2012-06-13: 0.5 mL via SUBCUTANEOUS
  Filled 2012-06-13 (×3): qty 0.5

## 2012-06-13 MED ORDER — OXYCODONE-ACETAMINOPHEN 5-325 MG PO TABS: 1.0000 | ORAL_TABLET | ORAL | Status: DC | PRN

## 2012-06-13 NOTE — Progress Notes (Signed)
Subjective: Postpartum Day 2 Cesarean Delivery Patient reports tolerating PO and no problems voiding.  Pt requests early d/c  Objective: Vital signs in last 24 hours: Temp:  [97.5 F (36.4 C)-97.8 F (36.6 C)] 97.5 F (36.4 C) (10/02 0619) Pulse Rate:  [57-79] 79  (10/02 0619) Resp:  [18-20] 18  (10/02 0619) BP: (99-127)/(59-64) 99/63 mmHg (10/02 0619) SpO2:  [100 %] 100 % (10/01 2054)  Physical Exam:  General: alert and cooperative Lochia: appropriate Uterine Fundus: firm Incision: healing well    Basename 06/12/12 0535  HGB 10.7*  HCT 30.7*    Assessment/Plan: Status post Cesarean section. Doing well postoperatively.  Discharge home with standard precautions and return to clinic in 1-2 weeks for incision check.  Oliver Pila 06/13/2012, 8:29 AM

## 2012-06-13 NOTE — Discharge Summary (Signed)
Obstetric Discharge Summary Reason for Admission: cesarean section Prenatal Procedures: none Intrapartum Procedures: cesarean: low cervical, transverse Postpartum Procedures: none Complications-Operative and Postpartum: none Hemoglobin  Date Value Range Status  06/12/2012 10.7* 12.0 - 15.0 g/dL Final     HCT  Date Value Range Status  06/12/2012 30.7* 36.0 - 46.0 % Final    Physical Exam:  General: alert and cooperative Lochia: appropriate Uterine Fundus: firm Incision: healing well   Discharge Diagnoses: Term Pregnancy-delivered  Discharge Information: Date: 06/13/2012 Activity: pelvic rest Diet: routine Medications: Ibuprofen and Percocet Condition: improved Instructions: refer to practice specific booklet Discharge to: home Follow-up Information    Follow up with Oliver Pila, MD. Schedule an appointment as soon as possible for a visit in 2 weeks. (Incision check)    Contact information:   [address], [address] [place] [address] [phone]          Newborn Data: Live born female  Birth Weight: 7 lb 7.1 oz (3375 g) APGAR: 8, 9  Home with mother.  Oliver Pila 06/13/2012, 8:34 AM

## 2012-12-11 ENCOUNTER — Emergency Department (HOSPITAL_BASED_OUTPATIENT_CLINIC_OR_DEPARTMENT_OTHER): Payer: Medicaid Other

## 2012-12-11 ENCOUNTER — Encounter (HOSPITAL_BASED_OUTPATIENT_CLINIC_OR_DEPARTMENT_OTHER): Payer: Self-pay | Admitting: *Deleted

## 2012-12-11 ENCOUNTER — Emergency Department (HOSPITAL_BASED_OUTPATIENT_CLINIC_OR_DEPARTMENT_OTHER)
Admission: EM | Admit: 2012-12-11 | Discharge: 2012-12-11 | Disposition: A | Discharge status: Home / Self Care | Payer: Medicaid Other | Attending: Emergency Medicine | Admitting: Emergency Medicine

## 2012-12-11 DIAGNOSIS — Y929 Unspecified place or not applicable: Secondary | ICD-10-CM | POA: Insufficient documentation

## 2012-12-11 DIAGNOSIS — X500XXA Overexertion from strenuous movement or load, initial encounter: Secondary | ICD-10-CM | POA: Insufficient documentation

## 2012-12-11 DIAGNOSIS — Z8659 Personal history of other mental and behavioral disorders: Secondary | ICD-10-CM | POA: Insufficient documentation

## 2012-12-11 DIAGNOSIS — Y9389 Activity, other specified: Secondary | ICD-10-CM | POA: Insufficient documentation

## 2012-12-11 DIAGNOSIS — Z8742 Personal history of other diseases of the female genital tract: Secondary | ICD-10-CM | POA: Insufficient documentation

## 2012-12-11 DIAGNOSIS — M549 Dorsalgia, unspecified: Secondary | ICD-10-CM

## 2012-12-11 DIAGNOSIS — Z8739 Personal history of other diseases of the musculoskeletal system and connective tissue: Secondary | ICD-10-CM | POA: Insufficient documentation

## 2012-12-11 DIAGNOSIS — F17200 Nicotine dependence, unspecified, uncomplicated: Secondary | ICD-10-CM | POA: Insufficient documentation

## 2012-12-11 DIAGNOSIS — IMO0002 Reserved for concepts with insufficient information to code with codable children: Secondary | ICD-10-CM | POA: Insufficient documentation

## 2012-12-11 MED ORDER — OXYCODONE-ACETAMINOPHEN 5-325 MG PO TABS: 2.0000 | ORAL_TABLET | ORAL | Status: DC | PRN

## 2012-12-11 MED ORDER — HYDROCODONE-ACETAMINOPHEN 5-325 MG PO TABS
1.0000 | ORAL_TABLET | Freq: Once | ORAL | Status: AC
  Administered 2012-12-11: 1 via ORAL
  Filled 2012-12-11: qty 1

## 2012-12-11 NOTE — ED Provider Notes (Signed)
History     CSN: [id number]  Arrival date & time 12/11/12  1536   First MD Initiated Contact with Patient 12/11/12 1545      Chief Complaint  Patient presents with  . Back Pain    (Consider location/radiation/quality/duration/timing/severity/associated sxs/prior treatment) HPI Comments: Pt states that she was lifting up her daughter and she developed pain in her lower back that radiates down her right leg:pt denies numbness or weakness:pt states that she had a hard time standing up but now she he is having a hard time bending over:pt states that she has a history of back problems and has been treated with flexeril in the past  Patient is a 27 y.o. female presenting with back pain. The history is provided by the patient. No language interpreter was used.  Back Pain Location:  Lumbar spine Quality:  Aching Radiates to:  R posterior upper leg Pain severity:  Moderate Pain is:  Unable to specify Onset quality:  Sudden Timing:  Constant Progression:  Unchanged Chronicity:  New Context: twisting   Relieved by:  Nothing Worsened by:  Nothing tried Associated symptoms: no bowel incontinence, no numbness, no perianal numbness and no tingling     Past Medical History  Diagnosis Date  . Fibroid   . PONV (postoperative nausea and vomiting)   . Depression     hx - no meds  . Heartburn in pregnancy     tx with tums  . Arthritis     deg disk disease - lower back    Past Surgical History  Procedure Laterality Date  . Cesarean section  2009    x 1  . Wisdom tooth extraction    . Cesarean section  06/11/2012    Procedure: CESAREAN SECTION;  Surgeon: Oliver Pila, MD;  Location: [facility];  Service: Obstetrics;  Laterality: N/A;  Repeat cesarean section with delivery of baby girl at 21. Apgars 8/9.    Family History  Problem Relation Age of Onset  . Anesthesia problems Neg Hx     History  Substance Use Topics  . Smoking status: Current Every Day Smoker -- 0.25 packs/day  for 10 years    Types: Cigarettes  . Smokeless tobacco: Never Used  . Alcohol Use: No    OB History   Grav Para Term Preterm Abortions TAB SAB Ect Mult Living   2 2 2  0 0 0 0 0 0 2      Review of Systems  Constitutional: Negative.   Respiratory: Negative.   Cardiovascular: Negative.   Gastrointestinal: Negative for bowel incontinence.  Musculoskeletal: Positive for back pain.  Neurological: Negative for tingling and numbness.    Allergies  Penicillins  Home Medications   Current Outpatient Rx  Name  Route  Sig  Dispense  Refill  . calcium carbonate (TUMS - DOSED IN MG ELEMENTAL CALCIUM) 500 MG chewable tablet   Oral   Chew 1 tablet by mouth daily as needed. For heartburn         . cyclobenzaprine (FLEXERIL) 5 MG tablet   Oral   Take 5 mg by mouth 2 (two) times daily as needed. backpain         . ibuprofen (ADVIL,MOTRIN) 600 MG tablet   Oral   Take 1 tablet (600 mg total) by mouth every 6 (six) hours as needed.   30 tablet   1   . metaxalone (SKELAXIN) 800 MG tablet   Oral   Take 800 mg by mouth 3 (three)  times daily.         Marland Kitchen oxyCODONE-acetaminophen (PERCOCET/ROXICET) 5-325 MG per tablet   Oral   Take 1-2 tablets by mouth every 4 (four) hours as needed (moderate - severe pain).   30 tablet   0   . Prenatal Vit-Fe Fumarate-FA (PRENATAL MULTIVITAMIN) TABS   Oral   Take 1 tablet by mouth at bedtime.           BP 110/70  Pulse 78  Temp(Src) 97.5 F (36.4 C) (Oral)  Resp 20  Wt 170 lb (77.111 kg)  BMI 26.62 kg/m2  SpO2 96%  Physical Exam  Nursing note and vitals reviewed. Constitutional: She is oriented to person, place, and time. She appears well-developed and well-nourished.  HENT:  Head: Normocephalic and atraumatic.  Eyes: Conjunctivae and EOM are normal.  Cardiovascular: Normal rate and regular rhythm.   Pulmonary/Chest: Effort normal.  Musculoskeletal: Normal range of motion.       Lumbar back: She exhibits tenderness and bony  tenderness. She exhibits normal range of motion.  Pt is able to do straight leg raises  Neurological: She is alert and oriented to person, place, and time. Coordination normal.  Skin: Skin is warm and dry.  Psychiatric: She has a normal mood and affect.    ED Course  Procedures (including critical care time)  Labs Reviewed - No data to display Dg Lumbar Spine Complete  12/11/2012  *RADIOLOGY REPORT*  Clinical Data: Back pain, lifting injury  LUMBAR SPINE - COMPLETE 4+ VIEW  Comparison: 04/10/2007  Findings: Normal alignment.  No compression fracture, wedge shaped deformity or focal kyphosis.  Stable chronic endplate changes at T12 anteriorly.  Preserved vertebral body heights.  No pars defects.  Normal SI joints.  IUD within the pelvis.  On the frontal view, radiopaque densities project over the right upper quadrant and are likely external to the patient.  IMPRESSION: No acute osseous finding.   Original Report Authenticated By: Judie Petit. Shick, M.D.      1. Back pain       MDM  Pt not having any neuro deficits:will treat symptomatically:pt is okay to follow up with Dr. Pearletha Forge for continued symptoms        Teressa Lower, NP 12/11/12 1703

## 2012-12-11 NOTE — ED Notes (Signed)
Back pain with radiation down her right leg. Pain started after picking up child from the crib.

## 2012-12-12 NOTE — ED Provider Notes (Signed)
Medical screening examination/treatment/procedure(s) were performed by non-physician practitioner and as supervising physician I was immediately available for consultation/collaboration.  Geoffery Lyons, MD 12/12/12 [phone]

## 2014-02-11 ENCOUNTER — Emergency Department (HOSPITAL_BASED_OUTPATIENT_CLINIC_OR_DEPARTMENT_OTHER)
Admission: EM | Admit: 2014-02-11 | Discharge: 2014-02-11 | Disposition: A | Discharge status: Home / Self Care | Payer: Self-pay | Attending: Emergency Medicine | Admitting: Emergency Medicine

## 2014-02-11 ENCOUNTER — Emergency Department (HOSPITAL_BASED_OUTPATIENT_CLINIC_OR_DEPARTMENT_OTHER): Payer: Medicaid Other

## 2014-02-11 ENCOUNTER — Encounter (HOSPITAL_BASED_OUTPATIENT_CLINIC_OR_DEPARTMENT_OTHER): Payer: Self-pay | Admitting: Emergency Medicine

## 2014-02-11 DIAGNOSIS — Z8659 Personal history of other mental and behavioral disorders: Secondary | ICD-10-CM | POA: Insufficient documentation

## 2014-02-11 DIAGNOSIS — Z8742 Personal history of other diseases of the female genital tract: Secondary | ICD-10-CM | POA: Insufficient documentation

## 2014-02-11 DIAGNOSIS — Z79899 Other long term (current) drug therapy: Secondary | ICD-10-CM | POA: Insufficient documentation

## 2014-02-11 DIAGNOSIS — Z8739 Personal history of other diseases of the musculoskeletal system and connective tissue: Secondary | ICD-10-CM | POA: Insufficient documentation

## 2014-02-11 DIAGNOSIS — N898 Other specified noninflammatory disorders of vagina: Secondary | ICD-10-CM | POA: Insufficient documentation

## 2014-02-11 DIAGNOSIS — Z880 Allergy status to penicillin: Secondary | ICD-10-CM | POA: Insufficient documentation

## 2014-02-11 DIAGNOSIS — F17200 Nicotine dependence, unspecified, uncomplicated: Secondary | ICD-10-CM | POA: Insufficient documentation

## 2014-02-11 DIAGNOSIS — Z3202 Encounter for pregnancy test, result negative: Secondary | ICD-10-CM | POA: Insufficient documentation

## 2014-02-11 LAB — WET PREP, GENITAL
TRICH WET PREP: NONE SEEN
WBC, Wet Prep HPF POC: NONE SEEN
Yeast Wet Prep HPF POC: NONE SEEN

## 2014-02-11 LAB — URINALYSIS, ROUTINE W REFLEX MICROSCOPIC
BILIRUBIN URINE: NEGATIVE
Glucose, UA: NEGATIVE mg/dL
Hgb urine dipstick: NEGATIVE
Ketones, ur: NEGATIVE mg/dL
Leukocytes, UA: NEGATIVE
NITRITE: NEGATIVE
SPECIFIC GRAVITY, URINE: 1.01 (ref 1.005–1.030)
UROBILINOGEN UA: 1 mg/dL (ref 0.0–1.0)
pH: 7 (ref 5.0–8.0)

## 2014-02-11 LAB — URINE MICROSCOPIC-ADD ON

## 2014-02-11 LAB — PREGNANCY, URINE: Preg Test, Ur: NEGATIVE

## 2014-02-11 MED ORDER — METRONIDAZOLE 500 MG PO TABS: 500.0000 mg | ORAL_TABLET | Freq: Two times a day (BID) | ORAL | Status: DC

## 2014-02-11 MED ORDER — AZITHROMYCIN 250 MG PO TABS
1000.0000 mg | ORAL_TABLET | Freq: Once | ORAL | Status: AC
Start: 2014-02-11 — End: 2014-02-11
  Administered 2014-02-11: 1000 mg via ORAL
  Filled 2014-02-11: qty 4

## 2014-02-11 MED ORDER — DOXYCYCLINE HYCLATE 100 MG PO CAPS: 100.0000 mg | ORAL_CAPSULE | Freq: Two times a day (BID) | ORAL | Status: DC

## 2014-02-11 MED ORDER — ONDANSETRON 4 MG PO TBDP
4.0000 mg | ORAL_TABLET | Freq: Once | ORAL | Status: AC
Start: 2014-02-11 — End: 2014-02-11
  Administered 2014-02-11: 4 mg via ORAL
  Filled 2014-02-11: qty 1

## 2014-02-11 MED ORDER — CEFTRIAXONE SODIUM 250 MG IJ SOLR
250.0000 mg | Freq: Once | INTRAMUSCULAR | Status: AC
  Administered 2014-02-11: 250 mg via INTRAMUSCULAR
  Filled 2014-02-11: qty 250

## 2014-02-11 MED ORDER — HYDROCODONE-ACETAMINOPHEN 5-325 MG PO TABS: 2.0000 | ORAL_TABLET | ORAL | Status: DC | PRN

## 2014-02-11 MED ORDER — HYDROCODONE-ACETAMINOPHEN 5-325 MG PO TABS
2.0000 | ORAL_TABLET | Freq: Once | ORAL | Status: AC
  Administered 2014-02-11: 2 via ORAL
  Filled 2014-02-11: qty 2

## 2014-02-11 NOTE — ED Notes (Signed)
Pt and family updated, pt now requesting something to drink and more graham crackers, encouraged patient to wait until md has spoke with her regarding poc

## 2014-02-11 NOTE — ED Notes (Signed)
Intermittent bil groin pain x1 month.  Vaginal DC.  Today pt is nauseated with loss of appetite.

## 2014-02-11 NOTE — Discharge Instructions (Signed)
Cervicitis Cervicitis is a soreness and swelling (inflammation) of the cervix. Your cervix is located at the bottom of your uterus. It opens up to the vagina. CAUSES   Sexually transmitted infections (STIs).   Allergic reaction.   Medicines or birth control devices that are put in the vagina.   Injury to the cervix.   Bacterial infections.  RISK FACTORS You are at greater risk if you:  Have unprotected sexual intercourse.  Have sexual intercourse with many partners.  Began sexual intercourse at an early age.  Have a history of STIs. SYMPTOMS  There may be no symptoms. If symptoms occur, they may include:   Grey, white, yellow, or bad-smelling vaginal discharge.   Pain or itching of the area outside the vagina.   Painful sexual intercourse.   Lower abdominal or lower back pain, especially during intercourse.   Frequent urination.   Abnormal vaginal bleeding between periods, after sexual intercourse, or after menopause.   Pressure or a heavy feeling in the pelvis.  DIAGNOSIS  Diagnosis is made after a pelvic exam. Other tests may include:   Examination of any discharge under a microscope (wet prep).   A Pap test.  TREATMENT  Treatment will depend on the cause of cervicitis. If it is caused by an STI, both you and your partner will need to be treated. Antibiotic medicines will be given.  HOME CARE INSTRUCTIONS   Do not have sexual intercourse until your health care provider says it is okay.   Do not have sexual intercourse until your partner has been treated, if your cervicitis is caused by an STI.   Take your antibiotics as directed. Finish them even if you start to feel better.  SEEK MEDICAL CARE IF:  Your symptoms come back.   You have a fever.  MAKE SURE YOU:   Understand these instructions.  Will watch your condition.  Will get help right away if you are not doing well or get worse. Document Released: 08/29/2005 Document Revised:  05/01/2013 Document Reviewed: 02/20/2013 [facility] Patient Information 2014 [place]. Bacterial Vaginosis Bacterial vaginosis is a vaginal infection that occurs when the normal balance of bacteria in the vagina is disrupted. It results from an overgrowth of certain bacteria. This is the most common vaginal infection in women of childbearing age. Treatment is important to prevent complications, especially in pregnant women, as it can cause a premature delivery. CAUSES  Bacterial vaginosis is caused by an increase in harmful bacteria that are normally present in smaller amounts in the vagina. Several different kinds of bacteria can cause bacterial vaginosis. However, the reason that the condition develops is not fully understood. RISK FACTORS Certain activities or behaviors can put you at an increased risk of developing bacterial vaginosis, including:  Having a new sex partner or multiple sex partners.  Douching.  Using an intrauterine device (IUD) for contraception. Women do not get bacterial vaginosis from toilet seats, bedding, swimming pools, or contact with objects around them. SIGNS AND SYMPTOMS  Some women with bacterial vaginosis have no signs or symptoms. Common symptoms include:  Grey vaginal discharge.  A fishlike odor with discharge, especially after sexual intercourse.  Itching or burning of the vagina and vulva.  Burning or pain with urination. DIAGNOSIS  Your health care provider will take a medical history and examine the vagina for signs of bacterial vaginosis. A sample of vaginal fluid may be taken. Your health care provider will look at this sample under a microscope to check for bacteria  and abnormal cells. A vaginal pH test may also be done.  TREATMENT  Bacterial vaginosis may be treated with antibiotic medicines. These may be given in the form of a pill or a vaginal cream. A second round of antibiotics may be prescribed if the condition comes back after  treatment.  HOME CARE INSTRUCTIONS   Only take over-the-counter or prescription medicines as directed by your health care provider.  If antibiotic medicine was prescribed, take it as directed. Make sure you finish it even if you start to feel better.  Do not have sex until treatment is completed.  Tell all sexual partners that you have a vaginal infection. They should see their health care provider and be treated if they have problems, such as a mild rash or itching.  Practice safe sex by using condoms and only having one sex partner. SEEK MEDICAL CARE IF:   Your symptoms are not improving after 3 days of treatment.  You have increased discharge or pain.  You have a fever. MAKE SURE YOU:   Understand these instructions.  Will watch your condition.  Will get help right away if you are not doing well or get worse. FOR MORE INFORMATION  Centers for Disease Control and Prevention, Division of STD Prevention: [employer] American Sexual Health Association (ASHA): www.ashastd.org  Document Released: 08/29/2005 Document Revised: 06/19/2013 Document Reviewed: 04/10/2013 [facility] Patient Information 2014 [place].

## 2014-02-11 NOTE — ED Notes (Signed)
Family member asking for pain medication for patient, pa sofia notified, will cont. To monitor

## 2014-02-11 NOTE — ED Provider Notes (Signed)
CSN: [id number]     Arrival date & time 02/11/14  1819 History   First MD Initiated Contact with Patient 02/11/14 1845     Chief Complaint  Patient presents with  . Vaginal Discharge     (Consider location/radiation/quality/duration/timing/severity/associated sxs/prior Treatment) Patient is a 28 y.o. female presenting with vaginal discharge. The history is provided by the patient. No language interpreter was used.  Vaginal Discharge Quality:  Bloody Severity:  Moderate Duration:  1 week Timing:  Constant Progression:  Worsening Chronicity:  New Relieved by:  Nothing Ineffective treatments:  None tried Associated symptoms: abdominal pain   Risk factors: STI   Risk factors: no STI exposure     Past Medical History  Diagnosis Date  . Fibroid   . PONV (postoperative nausea and vomiting)   . Depression     hx - no meds  . Heartburn in pregnancy     tx with tums  . Arthritis     deg disk disease - lower back   Past Surgical History  Procedure Laterality Date  . Cesarean section  2009    x 1  . Wisdom tooth extraction    . Cesarean section  06/11/2012    Procedure: CESAREAN SECTION;  Surgeon: Logan Bores, MD;  Location: [facility];  Service: Obstetrics;  Laterality: N/A;  Repeat cesarean section with delivery of baby girl at 13. Apgars 8/9.   Family History  Problem Relation Age of Onset  . Anesthesia problems Neg Hx    History  Substance Use Topics  . Smoking status: Current Every Day Smoker -- 0.50 packs/day for 10 years    Types: Cigarettes  . Smokeless tobacco: Never Used  . Alcohol Use: Yes     Comment: rarely   OB History   Grav Para Term Preterm Abortions TAB SAB Ect Mult Living   2 2 2  0 0 0 0 0 0 2     Review of Systems  Gastrointestinal: Positive for abdominal pain.  Genitourinary: Positive for vaginal discharge.  All other systems reviewed and are negative.     Allergies  Penicillins  Home Medications   Prior to Admission medications    Medication Sig Start Date End Date Taking? Authorizing Provider  calcium carbonate (TUMS - DOSED IN MG ELEMENTAL CALCIUM) 500 MG chewable tablet Chew 1 tablet by mouth daily as needed. For heartburn    Historical Provider, MD  cyclobenzaprine (FLEXERIL) 5 MG tablet Take 5 mg by mouth 2 (two) times daily as needed. backpain    Historical Provider, MD  ibuprofen (ADVIL,MOTRIN) 600 MG tablet Take 1 tablet (600 mg total) by mouth every 6 (six) hours as needed. 06/13/12   Logan Bores, MD  metaxalone (SKELAXIN) 800 MG tablet Take 800 mg by mouth 3 (three) times daily.    Historical Provider, MD  oxyCODONE-acetaminophen (PERCOCET/ROXICET) 5-325 MG per tablet Take 1-2 tablets by mouth every 4 (four) hours as needed (moderate - severe pain). 06/13/12   Logan Bores, MD  oxyCODONE-acetaminophen (PERCOCET/ROXICET) 5-325 MG per tablet Take 2 tablets by mouth every 4 (four) hours as needed for pain. 12/11/12   Glendell Docker, NP  Prenatal Vit-Fe Fumarate-FA (PRENATAL MULTIVITAMIN) TABS Take 1 tablet by mouth at bedtime.    Historical Provider, MD   BP 127/68  Pulse 70  Temp(Src) 98.7 F (37.1 C) (Oral)  Resp 16  Ht 5\' 6"  (1.676 m)  Wt 155 lb (70.308 kg)  BMI 25.03 kg/m2  SpO2 100%  LMP 02/09/2014  Breastfeeding? Yes Physical Exam  Constitutional: She appears well-developed and well-nourished.  HENT:  Head: Normocephalic and atraumatic.  Eyes: Conjunctivae are normal. Pupils are equal, round, and reactive to light.  Neck: Normal range of motion. Neck supple.  Cardiovascular: Normal rate.   Pulmonary/Chest: Effort normal and breath sounds normal.  Abdominal: Soft. There is no tenderness.  Genitourinary: Vaginal discharge found.  Vaginal discharge,  Thick brown,  Adnexa no masses,  Cervix nontender  Musculoskeletal: Normal range of motion.  Neurological: She is alert.  Skin: Skin is warm.    ED Course  Procedures (including critical care time) Labs Review Labs Reviewed  WET PREP,  GENITAL - Abnormal; Notable for the following:    Clue Cells Wet Prep HPF POC FEW (*)    All other components within normal limits  URINALYSIS, ROUTINE W REFLEX MICROSCOPIC - Abnormal; Notable for the following:    Protein, ur >300 (*)    All other components within normal limits  GC/CHLAMYDIA PROBE AMP  PREGNANCY, URINE  URINE MICROSCOPIC-ADD ON    Imaging Review US Transvaginal Non-ob  02/11/2014   CLINICAL DATA:  Pelvic pain.  Groin pain.  Back pain.  EXAM: TRANSABDOMINAL AND TRANSVAGINAL ULTRASOUND OF PELVIS  TECHNIQUE: Both transabdominal and transvaginal ultrasound examinations of the pelvis were performed. Transabdominal technique was performed for global imaging of the pelvis including uterus, ovaries, adnexal regions, and pelvic cul-de-sac. It was necessary to proceed with endovaginal exam following the transabdominal exam to visualize the adnexa.  COMPARISON:  None  FINDINGS: Uterus  Measurements: 8.2 x 4.7 x 5.1 cm. There is a 1.8 cm heterogeneous and hyperechoic focus adjacent to the endometrial canal in the fundus. There are similar echogenic foci within the endometrium of the lower uterine segment.  Endometrium  Thickness: 9 mm in thickness. IUD is in place. As described above, echogenic foci are present in the lower uterine segment.  Right ovary  Measurements: 3.2 x 1.3 x 2.0 cm. Normal appearance/no adnexal mass.  Left ovary  Measurements: 4.4 x 2.0 x 3.3 cm. Normal appearance/no adnexal mass.  Other findings  Small amount of free fluid.  IMPRESSION: There is an echogenic abnormality in the fundus adjacent to the endometrial canal which extends into the endometrial canal of the lower uterine segment. Endometrial abnormality of or abnormality of the junctional zone cannot be excluded. This could conceivably represent air bubbles, but would not explain the component adjacent to the endometrial canal. Sonohysterogram study may be helpful.   Electronically Signed   By: Maryclare Bean M.D.   On:  02/11/2014 20:17   US Pelvis Complete  02/11/2014   CLINICAL DATA:  Pelvic pain.  Groin pain.  Back pain.  EXAM: TRANSABDOMINAL AND TRANSVAGINAL ULTRASOUND OF PELVIS  TECHNIQUE: Both transabdominal and transvaginal ultrasound examinations of the pelvis were performed. Transabdominal technique was performed for global imaging of the pelvis including uterus, ovaries, adnexal regions, and pelvic cul-de-sac. It was necessary to proceed with endovaginal exam following the transabdominal exam to visualize the adnexa.  COMPARISON:  None  FINDINGS: Uterus  Measurements: 8.2 x 4.7 x 5.1 cm. There is a 1.8 cm heterogeneous and hyperechoic focus adjacent to the endometrial canal in the fundus. There are similar echogenic foci within the endometrium of the lower uterine segment.  Endometrium  Thickness: 9 mm in thickness. IUD is in place. As described above, echogenic foci are present in the lower uterine segment.  Right ovary  Measurements: 3.2 x 1.3 x 2.0 cm. Normal appearance/no adnexal mass.  Left ovary  Measurements: 4.4 x 2.0 x 3.3 cm. Normal appearance/no adnexal mass.  Other findings  Small amount of free fluid.  IMPRESSION: There is an echogenic abnormality in the fundus adjacent to the endometrial canal which extends into the endometrial canal of the lower uterine segment. Endometrial abnormality of or abnormality of the junctional zone cannot be excluded. This could conceivably represent air bubbles, but would not explain the component adjacent to the endometrial canal. Sonohysterogram study may be helpful.   Electronically Signed   By: Maryclare Bean M.D.   On: 02/11/2014 20:17     EKG Interpretation None      MDM   Final diagnoses:  Vaginal discharge     Pt given rocephin and zithromax,  rx for doxycycine. Flagyl and hydrocodone.  Pt advised to follow up with her gyn or call women's clinic for followup   Fransico Meadow, PA-C 02/11/14 2242

## 2014-02-12 LAB — GC/CHLAMYDIA PROBE AMP
CT PROBE, AMP APTIMA: NEGATIVE
GC Probe RNA: NEGATIVE

## 2014-02-12 NOTE — ED Provider Notes (Signed)
Medical screening examination/treatment/procedure(s) were performed by non-physician practitioner and as supervising physician I was immediately available for consultation/collaboration.   EKG Interpretation None        Mariea Clonts, MD 02/12/14 [phone]

## 2014-02-13 ENCOUNTER — Inpatient Hospital Stay (HOSPITAL_COMMUNITY)
Admission: AD | Admit: 2014-02-13 | Discharge: 2014-02-13 | Disposition: A | Discharge status: Home / Self Care | Payer: Medicaid Other | Source: Ambulatory Visit | Attending: Obstetrics and Gynecology | Admitting: Obstetrics and Gynecology

## 2014-02-13 ENCOUNTER — Encounter (HOSPITAL_COMMUNITY): Payer: Self-pay | Admitting: *Deleted

## 2014-02-13 DIAGNOSIS — F17200 Nicotine dependence, unspecified, uncomplicated: Secondary | ICD-10-CM | POA: Insufficient documentation

## 2014-02-13 DIAGNOSIS — R1032 Left lower quadrant pain: Secondary | ICD-10-CM | POA: Insufficient documentation

## 2014-02-13 DIAGNOSIS — N949 Unspecified condition associated with female genital organs and menstrual cycle: Secondary | ICD-10-CM | POA: Insufficient documentation

## 2014-02-13 LAB — URINALYSIS, ROUTINE W REFLEX MICROSCOPIC
BILIRUBIN URINE: NEGATIVE
GLUCOSE, UA: NEGATIVE mg/dL
HGB URINE DIPSTICK: NEGATIVE
Ketones, ur: NEGATIVE mg/dL
LEUKOCYTES UA: NEGATIVE
Nitrite: NEGATIVE
PH: 6 (ref 5.0–8.0)
Protein, ur: NEGATIVE mg/dL
Specific Gravity, Urine: <1.005 — ABNORMAL LOW (ref 1.005–1.030)
Urobilinogen, UA: 0.2 mg/dL (ref 0.0–1.0)

## 2014-02-13 MED ORDER — OXYCODONE-ACETAMINOPHEN 5-325 MG PO TABS: 1.0000 | ORAL_TABLET | ORAL | Status: DC | PRN

## 2014-02-13 NOTE — MAU Provider Note (Signed)
Attestation of Attending Supervision of Advanced Practitioner (CNM/NP): Evaluation and management procedures were performed by the Advanced Practitioner under my supervision and collaboration.  I have reviewed the Advanced Practitioner's note and chart, and I agree with the management and plan.  Foch Rosenwald 02/13/2014 8:45 PM

## 2014-02-13 NOTE — MAU Note (Signed)
Pain started about a month ago and has been getting worse.Pt states is sharp

## 2014-02-13 NOTE — Discharge Instructions (Signed)
Abdominal Pain, Adult Many things can cause abdominal pain. Usually, abdominal pain is not caused by a disease and will improve without treatment. It can often be observed and treated at home. Your health care provider will do a physical exam and possibly order blood tests and X-rays to help determine the seriousness of your pain. However, in many cases, more time must pass before a clear cause of the pain can be found. Before that point, your health care provider may not know if you need more testing or further treatment. HOME CARE INSTRUCTIONS  Monitor your abdominal pain for any changes. The following actions may help to alleviate any discomfort you are experiencing:  Only take over-the-counter or prescription medicines as directed by your health care provider.  Do not take laxatives unless directed to do so by your health care provider.  Try a clear liquid diet (broth, tea, or water) as directed by your health care provider. Slowly move to a bland diet as tolerated. SEEK MEDICAL CARE IF:  You have unexplained abdominal pain.  You have abdominal pain associated with nausea or diarrhea.  You have pain when you urinate or have a bowel movement.  You experience abdominal pain that wakes you in the night.  You have abdominal pain that is worsened or improved by eating food.  You have abdominal pain that is worsened with eating fatty foods. SEEK IMMEDIATE MEDICAL CARE IF:   Your pain does not go away within 2 hours.  You have a fever.  You keep throwing up (vomiting).  Your pain is felt only in portions of the abdomen, such as the right side or the left lower portion of the abdomen.  You pass bloody or black tarry stools. MAKE SURE YOU:  Understand these instructions.   Will watch your condition.   Will get help right away if you are not doing well or get worse.  Document Released: 06/08/2005 Document Revised: 06/19/2013 Document Reviewed: 05/08/2013 [facility] Patient  Information 2014 [place].  [place] (Revised August 2014)   Chronic Pain Problems:    [facility]:  [phone]           Patients need to be referred by their primary care doctor/specialist  Insufficient Money for Medicine:           United Way: call "211"     MAP Program at [place] or [place] [phone]            No Primary Care Doctor:  To locate a primary care doctor that accepts your insurance or provides certain services:           [place]: [phone]           Physician Referral Service: [phone] ask for My [place]   If no insurance, you need to see if you qualify for [facility] orange card, call to set      up appointment for eligibility/enrollment at [phone] or [phone] or visit [place] ([address], [place] and [place]) to meet with a [facility] enrollment specialist.  Agencies that provide inexpensive (sliding fee scale) medical care:        [facility] - Family Medicine at [place] - [phone]      [facility] and [place] - [phone]      [facility] - [phone]  [facility] - [phone]      [facility] [phone]- [phone]      [facility] [phone]   [facility] - [facility] @ [place] [phone]- [phone]   [facility] - [facility] @ [place] - [phone]   [facility]: [phone]    Women's Clinic: [phone]    Planned Parenthood: [phone]    [facility]- [phone]    Medicaid-accepting [facility] Providers:           [facility] - [phone] (No Family Planning accepted)          [address],  [phone], Mon-Fri 9am-5pm          [facility] - [phone]   [address], [address] [place], Mon-Thursday 8am-5pm, Fri 8am-noon   [facility] - [phone]          [address], [address], Mon-Fri 7:30am-4:30pm          [facility] - [phone]          [address], [place]-Fri 8am-5pm          [facility] - [phone]          [address], [address]          Only accepts [place] Goldman Sachsccess Medicaid patients after they have their name applied to their card  Self Pay (no insurance) in [facility]:           Sickle Cell Patients:    [address], [phone] [facility]:   [address], [place] [phone]       [facility]   [address], [place] [phone]  [facility]:   [address], [phone]          [facility]           [address], [phone] [facility]   [address], [phone]           [facility] [place]           [address], [address], [place] [phone]        [facility] - [address], [address]           [phone], Mon-Fri 9am-7pm, [place] 9am-1pm          [facility] - Family Medicine @ [facility]          [address], [phone]          [facility] - [facility]           [address], [phone] [facility] - [facility] - [place]   [address], [place] [phone]          [facility]           [address], [phone]  [facility] - [facility]    [address], [phone] [facility] -  [facility]   [address], [phone]  Dr. Julio Sicks            [address], [place], [phone]          [facility]           [address] [phone]          [facility]             [address], [phone]          [facility]           [address], [phone], 1st & 3rd Saturday every month, 10am-1pm  OTHERS:  Faith Action  (Immigration [employer] Only)  [phone] (Thursday only)  Strategies for finding a Primary Care Provider:  1) Find a Doctor and Pay Out of Pocket  Although you won't have to find out who is covered by your insurance plan, it is a good idea to ask around and get recommendations. You will then need to call the office and see if the doctor you have chosen will accept you as a new patient and what types of options they offer for patients who are self-pay. Some doctors offer discounts or will set up payment plans for their patients who do not have insurance, but you will need to ask so you aren't surprised when you get to your appointment.  2) Contact [place] [employer] - To see if you qualify for orange card access to healthcare safety net providers.  Call for appointment for eligibility/enrollment at [phone] or [phone]. (Uninsured, 0-200% FPL, qualifying info)  Applicants for [facility] are first required to see if they are eligible to enroll in the [facility] Marketplace before enrolling in [facility] (and get an exemption if they are not).  GCCN Criteria for acceptance is:    Proof of Engineering geologist exemption - form or documentation    Valid photo ID (driver's license, state identification card, passport, home country ID)    Proof of [facility] residency (e.g. drivers license, lease/landlord information, pay stubs with address, utility bill, bank statement, etc.)    Proof of income (1040, last year's tax return, W2, 4 current pay stubs, other income proof)    Proof of assets (current bank statement + 3 most recent, disability paperwork, life  insurance info, tax value on autos, etc.)  3) Contact Your Local Health Department  Not all health departments have doctors that can see patients for sick visits, but many do, so it is worth a call to see if yours does. If you don't know where your local health department is, you can check in your phone book. The CDC also has a tool to help you locate your state's health department, and many state websites also have listings of all of their local health departments.  4) Find a Walk-in Clinic  If your illness is not likely to be very severe or complicated, you may want to try a walk in clinic. These are popping up all over the country in pharmacies, drugstores, and shopping centers. They're usually staffed by nurse practitioners or physician assistants that have been trained to treat common illnesses and complaints. They're usually fairly quick and inexpensive. However, if you have serious medical issues or chronic medical problems, these are probably not your best option   STD Testing:           [facility]  [place], [place] [facility]           [address], [place], phone [phone] or [phone]           Monday - Friday, call for an appointment          [place], [place] [facility]           [address], [place], phone [phone] or [phone]           Monday - Friday, call for an appointment Abuse/Neglect:           [place]: [place]: [phone] (After Hours)  Emergency Shelter:  [facility] [phone]  [place]- [phone]  [place] - [phone]  Youth Focus - Act Together - [phone] (ages 21-17)  [place] @ [employer] - [phone]   Mammograms - Free at [facility] [place]:           Room at the [place]: [phone]   (Homeless mother with children)          [place]: [phone] (Mothers only)   Youth Focus: [phone] (Pregnant 11-36 years old)   Adopt a Mom -[phone]  [facility]    [facility] [place]   [address] [place] [phone]          [facility]           [address], [place], [place]          [facility].           [place], [place]          [facility]           [phone]          [facility] in [place]           [phone], [place]           [phone]           [phone] (After Hours)  [place] Abuse Resources:           Alcohol and Drug Services: [phone]           [facility]: [place]: [phone]    Narcotics Helpline - [phone]          [facility]: [phone]  Residential & Outpatient Substance Abuse Program - [place]: [phone]   [employer]  [place] - [phone] Psychological Services:          [place]: [phone]    Therapeutic Alternatives: [phone]          [place]           [address]: [phone]     (24 Hour)   Mobile Crisis:    [place]:  Radio producer on [place] [phone] [facility] on [place] [phone]   Walk In [place]  ([place] - [phone] or [phone]  [place]. [place] [phone]  [place]mit [address] [place] [phone]   Dental Assistance:  If unable to pay or uninsured, contact: [facility]. to become qualified for the adult dental clinic. Patient must be enrolled in [facility] (uninsured, 0-200% FPL, qualifying info).  Enroll in [facility] first, then see Primary Care Physician assigned to you, the PCP makes a dental referral. [place] [facility] Access Program will receive referral and contacts patient for appointment.  Patients with Medicaid           [address], [place] (Children up to 85 + Pregnant Women) - [phone]  [place] - Suite [phone]  If unable to pay, or uninsured: contact [place] [phone] in [place] - ([place] only + Pregnant Women), [phone] in [place] only) to become qualified for the adult dental clinic  Must see if eligible to enroll in [place] before enrolling into the [facility] (exemption required) [phone] for an appointment)  [email or web address];   [phone].  If not eligible for ACA, then go by Department of Health and Human Services to see if eligible for orange card.  [address], [place].  Once you get an orange card, you will have a Primary Care home who will then refer you to dental if needed.        Other Scientist, forensic:   [facility] [phone] (ext [phone])   [address]  Dr. Donn Pierini - [phone]   [place]   [address] [facility]           [place], [place], [place], [address]           [phone], Ext. [phone]            2nd and 4th Thursday of the month at 6:30am (Simple extractions only - no wisdom teeth or surgery) First come/First serve -First 10 clients served           [facility], [place] and [facility] residents only)          [address], [place], [place], [address]           [phone]  [place]          [place]         [facility]          [phone]   Transportation Options:  Ambulance - 911 - [phone] per ride Family Member to accompany patient (if stable) - [place] - [phone]  PART - [phone]  Taxi - [phone] - [place] [phone] (Application required)  [facility] - [phone]

## 2014-02-13 NOTE — MAU Provider Note (Signed)
History     CSN: [id number]  Arrival date and time: 02/13/14 1500   First Provider Initiated Contact with Patient 02/13/14 1547      Chief Complaint  Patient presents with  . Pelvic Pain   HPI Ms. Lauren Mcmahon is a 28 y.o. [id number] who presents to MAU today with complaint of LLQ pain. The patient was seen for the same issue at [facility] on 02/11/14. Cultures were negative and US showed an abnormality of the endometrium possibly representing polyp. The patient states that the pain is worsening since 02/11/14. She denies vaginal bleeding. She states no change in vaginal discharge. She is on antibiotics for possible PID. She has had nausea without vomiting, diarrhea or constipation. She denies UTI symptoms, hematuria. She states that the pain has been off and on x 2 months. She states that it is worse with her periods. She states light spotting for periods due to Mirena IUD. She denies fever.   OB History   Grav Para Term Preterm Abortions TAB SAB Ect Mult Living   2 2 2  0 0 0 0 0 0 2      Past Medical History  Diagnosis Date  . Fibroid   . PONV (postoperative nausea and vomiting)   . Depression     hx - no meds  . Heartburn in pregnancy     tx with tums  . Arthritis     deg disk disease - lower back    Past Surgical History  Procedure Laterality Date  . Cesarean section  2009    x 1  . Wisdom tooth extraction    . Cesarean section  06/11/2012    Procedure: CESAREAN SECTION;  Surgeon: Logan Bores, MD;  Location: [facility];  Service: Obstetrics;  Laterality: N/A;  Repeat cesarean section with delivery of baby girl at 20. Apgars 8/9.    Family History  Problem Relation Age of Onset  . Anesthesia problems Neg Hx   . Hypertension Mother   . Cancer Maternal Grandmother   . Heart disease Maternal Grandfather   . Diabetes Paternal Grandfather   . Hypertension Paternal Grandfather     History  Substance Use Topics  . Smoking status: Current Every Day Smoker -- 0.50  packs/day for 10 years    Types: Cigarettes  . Smokeless tobacco: Never Used  . Alcohol Use: Yes     Comment: rarely    Allergies:  Allergies  Allergen Reactions  . Penicillins Nausea Only and Rash    No prescriptions prior to admission    Review of Systems  Constitutional: Negative for fever and malaise/fatigue.  Gastrointestinal: Positive for nausea and abdominal pain. Negative for vomiting, diarrhea and constipation.  Genitourinary: Negative for dysuria, urgency, frequency and hematuria.       Neg - vaginal bleeding + vaginal discharge   Physical Exam   Blood pressure 110/62, pulse 54, temperature 97.9 F (36.6 C), temperature source Oral, resp. rate 18, last menstrual period 02/09/2014, currently breastfeeding.  Physical Exam  Constitutional: She is oriented to person, place, and time. She appears well-developed and well-nourished. No distress.  HENT:  Head: Normocephalic and atraumatic.  Cardiovascular: Normal rate.   Respiratory: Effort normal.  GI: Soft. Bowel sounds are normal. She exhibits no distension and no mass. There is tenderness (moderate tenderness to palpation of the LLQ). There is guarding. There is no rebound.  Neurological: She is alert and oriented to person, place, and time.  Skin: Skin is warm and dry.  No erythema.  Psychiatric: She has a normal mood and affect.   Results for orders placed during the hospital encounter of 02/13/14 (from the past 24 hour(s))  URINALYSIS, ROUTINE W REFLEX MICROSCOPIC     Status: Abnormal   Collection Time    02/13/14  3:15 PM      Result Value Ref Range   Color, Urine YELLOW  YELLOW   APPearance CLEAR  CLEAR   Specific Gravity, Urine <1.005 (*) 1.005 - 1.030   pH 6.0  5.0 - 8.0   Glucose, UA NEGATIVE  NEGATIVE mg/dL   Hgb urine dipstick NEGATIVE  NEGATIVE   Bilirubin Urine NEGATIVE  NEGATIVE   Ketones, ur NEGATIVE  NEGATIVE mg/dL   Protein, ur NEGATIVE  NEGATIVE mg/dL   Urobilinogen, UA 0.2  0.0 - 1.0 mg/dL    Nitrite NEGATIVE  NEGATIVE   Leukocytes, UA NEGATIVE  NEGATIVE    MAU Course  Procedures  MDM Discussed patient with Dr. Elly Modena. Recommends completion of antibiotics and then repeat US in 1 month and follow-up in clinic for results and management  Assessment and Plan  A: LLQ pain  P: Discharge home Rx for Percocet given to patient Referred to [facility] for follow-up in ~ 1 month after repeat US scheduled for 03/13/14 Patient given resources for PCP and advised to start care ASAP Patient may return to MAU as needed or if her condition were to change or worsen   Farris Has 02/13/2014, 6:19 PM

## 2014-02-19 ENCOUNTER — Encounter: Payer: Self-pay | Admitting: Obstetrics and Gynecology

## 2014-03-13 ENCOUNTER — Ambulatory Visit (HOSPITAL_COMMUNITY): Admit: 2014-03-13 | Payer: Medicaid Other

## 2014-03-19 ENCOUNTER — Telehealth: Payer: Self-pay

## 2014-03-19 ENCOUNTER — Encounter: Payer: Medicaid Other | Admitting: Obstetrics and Gynecology

## 2014-03-19 NOTE — Telephone Encounter (Signed)
Patient no showed [place] appointment that needed to be completed prior to clinic appointment. Dr. Elly Modena stated patient appointment for today should be canceled, [place] rescheduled, and then clinic appointment after completed US. Attempted to call patient. No answer. Left message stating her appointment today is canceled due to missing [place], explained it is important to have [place] so Alila Sotero has results to form plan of care, and that I would reschedule ultrasound and call her with new appointment date and time and that she will hear from front office staff with rescheduled clinic appointment.  [place] rescheduled for Wednesday  03/26/14 1115. Called patient and left message informing her of new [place] appointment date, time and location as well as the number to call and reschedule ultrasound if need be. Message sent to admin pool to reschedule patient's clinic appointment.

## 2014-03-26 ENCOUNTER — Ambulatory Visit (HOSPITAL_COMMUNITY): Admission: RE | Admit: 2014-03-26 | Payer: Self-pay | Source: Ambulatory Visit

## 2014-04-11 ENCOUNTER — Telehealth: Payer: Self-pay | Admitting: Obstetrics and Gynecology

## 2014-04-11 ENCOUNTER — Encounter: Payer: Self-pay | Admitting: Obstetrics & Gynecology

## 2014-04-11 ENCOUNTER — Encounter: Payer: Self-pay | Admitting: Obstetrics and Gynecology

## 2014-04-11 NOTE — Telephone Encounter (Signed)
Pt missed appt today 04/11/14 @0915 . Called pt to notify and if she'd like to re-schedule. No answer. Left message to call clinic back. Letter sent.

## 2014-07-14 ENCOUNTER — Encounter (HOSPITAL_COMMUNITY): Payer: Self-pay | Admitting: *Deleted

## 2014-12-19 ENCOUNTER — Telehealth: Payer: Self-pay | Admitting: *Deleted

## 2017-07-12 ENCOUNTER — Emergency Department (HOSPITAL_COMMUNITY)
Admission: EM | Admit: 2017-07-12 | Discharge: 2017-07-12 | Disposition: A | Discharge status: Home / Self Care | Payer: Self-pay | Attending: Emergency Medicine | Admitting: Emergency Medicine

## 2017-07-12 ENCOUNTER — Encounter (HOSPITAL_COMMUNITY): Payer: Self-pay | Admitting: Emergency Medicine

## 2017-07-12 ENCOUNTER — Emergency Department (HOSPITAL_COMMUNITY): Payer: Self-pay

## 2017-07-12 DIAGNOSIS — F17210 Nicotine dependence, cigarettes, uncomplicated: Secondary | ICD-10-CM | POA: Insufficient documentation

## 2017-07-12 DIAGNOSIS — Q600 Renal agenesis, unilateral: Secondary | ICD-10-CM

## 2017-07-12 DIAGNOSIS — N281 Cyst of kidney, acquired: Secondary | ICD-10-CM | POA: Insufficient documentation

## 2017-07-12 DIAGNOSIS — N10 Acute tubulo-interstitial nephritis: Secondary | ICD-10-CM | POA: Insufficient documentation

## 2017-07-12 DIAGNOSIS — IMO0002 Reserved for concepts with insufficient information to code with codable children: Secondary | ICD-10-CM

## 2017-07-12 LAB — URINALYSIS, ROUTINE W REFLEX MICROSCOPIC
BILIRUBIN URINE: NEGATIVE
Glucose, UA: NEGATIVE mg/dL
Hgb urine dipstick: NEGATIVE
KETONES UR: NEGATIVE mg/dL
Nitrite: NEGATIVE
PH: 6 (ref 5.0–8.0)
Protein, ur: NEGATIVE mg/dL
Specific Gravity, Urine: 1.003 — ABNORMAL LOW (ref 1.005–1.030)

## 2017-07-12 LAB — CBC
HCT: 40.6 % (ref 36.0–46.0)
Hemoglobin: 13.6 g/dL (ref 12.0–15.0)
MCH: 31.6 pg (ref 26.0–34.0)
MCHC: 33.5 g/dL (ref 30.0–36.0)
MCV: 94.2 fL (ref 78.0–100.0)
Platelets: 196 10*3/uL (ref 150–400)
RBC: 4.31 MIL/uL (ref 3.87–5.11)
RDW: 13.9 % (ref 11.5–15.5)
WBC: 13.1 10*3/uL — AB (ref 4.0–10.5)

## 2017-07-12 LAB — COMPREHENSIVE METABOLIC PANEL
ALT: 14 U/L (ref 14–54)
AST: 23 U/L (ref 15–41)
Albumin: 3.4 g/dL — ABNORMAL LOW (ref 3.5–5.0)
Alkaline Phosphatase: 77 U/L (ref 38–126)
Anion gap: 9 (ref 5–15)
BUN: <5 mg/dL — ABNORMAL LOW (ref 6–20)
CALCIUM: 8.9 mg/dL (ref 8.9–10.3)
CHLORIDE: 102 mmol/L (ref 101–111)
CO2: 24 mmol/L (ref 22–32)
Creatinine, Ser: 0.82 mg/dL (ref 0.44–1.00)
Glucose, Bld: 112 mg/dL — ABNORMAL HIGH (ref 65–99)
Potassium: 4 mmol/L (ref 3.5–5.1)
Sodium: 135 mmol/L (ref 135–145)
Total Bilirubin: 0.8 mg/dL (ref 0.3–1.2)
Total Protein: 6.6 g/dL (ref 6.5–8.1)

## 2017-07-12 LAB — POC URINE PREG, ED: PREG TEST UR: NEGATIVE

## 2017-07-12 LAB — LIPASE, BLOOD: Lipase: 27 U/L (ref 11–51)

## 2017-07-12 MED ORDER — DEXTROSE 5 % IV SOLN
1.0000 g | Freq: Once | INTRAVENOUS | Status: AC
  Administered 2017-07-12: 1 g via INTRAVENOUS
  Filled 2017-07-12: qty 10

## 2017-07-12 MED ORDER — SULFAMETHOXAZOLE-TRIMETHOPRIM 800-160 MG PO TABS: 1.0000 | ORAL_TABLET | Freq: Two times a day (BID) | ORAL | 0 refills | Status: AC

## 2017-07-12 MED ORDER — IOPAMIDOL (ISOVUE-300) INJECTION 61%
INTRAVENOUS | Status: AC
Start: 2017-07-12 — End: 2017-07-12
  Administered 2017-07-12: 100 mL
  Filled 2017-07-12: qty 100

## 2017-07-12 MED ORDER — OXYCODONE-ACETAMINOPHEN 5-325 MG PO TABS: 2.0000 | ORAL_TABLET | ORAL | 0 refills | Status: DC | PRN

## 2017-07-12 MED ORDER — ONDANSETRON HCL 4 MG PO TABS: 4.0000 mg | ORAL_TABLET | Freq: Four times a day (QID) | ORAL | 0 refills | Status: DC

## 2017-07-12 MED ORDER — ONDANSETRON HCL 4 MG/2ML IJ SOLN
4.0000 mg | Freq: Once | INTRAMUSCULAR | Status: AC
  Administered 2017-07-12: 4 mg via INTRAVENOUS
  Filled 2017-07-12: qty 2

## 2017-07-12 MED ORDER — SODIUM CHLORIDE 0.9 % IV BOLUS (SEPSIS)
1000.0000 mL | Freq: Once | INTRAVENOUS | Status: AC
  Administered 2017-07-12: 1000 mL via INTRAVENOUS

## 2017-07-12 MED ORDER — MORPHINE SULFATE (PF) 4 MG/ML IV SOLN
6.0000 mg | Freq: Once | INTRAVENOUS | Status: AC
  Administered 2017-07-12: 6 mg via INTRAVENOUS
  Filled 2017-07-12: qty 2

## 2017-07-12 MED ORDER — MORPHINE SULFATE (PF) 4 MG/ML IV SOLN
4.0000 mg | Freq: Once | INTRAVENOUS | Status: AC
  Administered 2017-07-12: 4 mg via INTRAVENOUS
  Filled 2017-07-12: qty 1

## 2017-07-12 NOTE — ED Provider Notes (Signed)
[facility] EMERGENCY DEPARTMENT Provider Note   CSN: [id number] Arrival date & time: 07/12/17  1154     History   Chief Complaint Chief Complaint  Patient presents with  . Abdominal Pain    HPI Lauren Mcmahon is a 31 y.o. female.  HPI   31 year old female presents today with complaints of abdominal pain.  Patient reports approximate 3 days ago she developed a sharp pain in her left lower abdomen and flank.  She notes this is come and gone, describes it as a 7 out of 10 pain.  Patient notes associated nausea and vomiting last episode today.  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  She denies any changes in her urine or bowel movements.  She denies any right-sided abdominal pain.  She denies any fevers at home but notes chills.    Past Medical History:  Diagnosis Date  . Arthritis    deg disk disease - lower back  . Depression    hx - no meds  . Fibroid   . Heartburn in pregnancy    tx with tums  . PONV (postoperative nausea and vomiting)     There are no active problems to display for this patient.   Past Surgical History:  Procedure Laterality Date  . CESAREAN SECTION  2009   x 1  . CESAREAN SECTION  06/11/2012   Procedure: CESAREAN SECTION;  Surgeon: Logan Bores, MD;  Location: [facility];  Service: Obstetrics;  Laterality: N/A;  Repeat cesarean section with delivery of baby girl at 31. Apgars 8/9.  Marland Kitchen WISDOM TOOTH EXTRACTION      OB History    Gravida Para Term Preterm AB Living   2 2 2  0 0 2   SAB TAB Ectopic Multiple Live Births   0 0 0 0 2       Home Medications    Prior to Admission medications   Medication Sig Start Date End Date Taking? Authorizing Provider  Aspirin-Acetaminophen-Caffeine (GOODY HEADACHE PO) Take 1 packet by mouth daily as needed (For headache.).    [provider]  calcium carbonate (TUMS - DOSED IN MG ELEMENTAL CALCIUM) 500 MG chewable tablet Chew 1 tablet by mouth  daily as needed. For heartburn    [provider]  doxycycline (VIBRAMYCIN) 100 MG capsule Take 1 capsule (100 mg total) by mouth 2 (two) times daily. 02/11/14   Fransico Meadow, PA-C  HYDROcodone-acetaminophen (NORCO/VICODIN) 5-325 MG per tablet Take 2 tablets by mouth every 4 (four) hours as needed. 02/11/14   Fransico Meadow, PA-C  ibuprofen (ADVIL,MOTRIN) 200 MG tablet Take 400 mg by mouth every 6 (six) hours as needed for mild pain.    [provider]  metroNIDAZOLE (FLAGYL) 500 MG tablet Take 1 tablet (500 mg total) by mouth 2 (two) times daily. 02/11/14   Fransico Meadow, PA-C  ondansetron (ZOFRAN) 4 MG tablet Take 1 tablet (4 mg total) by mouth every 6 (six) hours. 07/12/17   Kemiah Booz, Dellis Filbert, PA-C  OVER THE COUNTER MEDICATION Place 1 drop into both eyes daily as needed (For dryness.). Patient uses Rohto Cool eye drops.    [provider]  oxyCODONE-acetaminophen (PERCOCET/ROXICET) 5-325 MG tablet Take 2 tablets by mouth every 4 (four) hours as needed for severe pain. 07/12/17   Netta Fodge, Dellis Filbert, PA-C  sulfamethoxazole-trimethoprim (BACTRIM DS,SEPTRA DS) 800-160 MG tablet Take 1 tablet by mouth 2 (two) times daily. 07/12/17 07/19/17  Okey Regal, PA-C  Family History Family History  Problem Relation Age of Onset  . Hypertension Mother   . Cancer Maternal Grandmother   . Heart disease Maternal Grandfather   . Diabetes Paternal Grandfather   . Hypertension Paternal Grandfather   . Anesthesia problems Neg Hx     Social History Social History  Substance Use Topics  . Smoking status: Current Every Day Smoker    Packs/day: 0.50    Years: 10.00    Types: Cigarettes  . Smokeless tobacco: Never Used  . Alcohol use Yes     Comment: rarely     Allergies   Penicillins   Review of Systems Review of Systems  All other systems reviewed and are negative.   Physical Exam Updated Vital Signs BP 120/74   Pulse 85   Temp 99 F (37.2 C) (Oral)   Resp 16    Ht 5\' 7"  (1.702 m)   Wt 64.9 kg (143 lb)   LMP 06/29/2017 (Approximate)   SpO2 99%   Breastfeeding? No   BMI 22.40 kg/m   Physical Exam  Constitutional: She is oriented to person, place, and time. She appears well-developed and well-nourished.  HENT:  Head: Normocephalic and atraumatic.  Eyes: Pupils are equal, round, and reactive to light. Conjunctivae are normal. Right eye exhibits no discharge. Left eye exhibits no discharge. No scleral icterus.  Neck: Normal range of motion. No JVD present. No tracheal deviation present.  Pulmonary/Chest: Effort normal. No stridor.  Abdominal: Soft. She exhibits no distension and no mass. There is tenderness. There is no rebound and no guarding. No hernia.  Left sided abd TTP   Neurological: She is alert and oriented to person, place, and time. Coordination normal.  Psychiatric: She has a normal mood and affect. Her behavior is normal. Judgment and thought content normal.  Nursing note and vitals reviewed.    ED Treatments / Results  Labs (all labs ordered are listed, but only abnormal results are displayed) Labs Reviewed  COMPREHENSIVE METABOLIC PANEL - Abnormal; Notable for the following:       Result Value   Glucose, Bld 112 (*)    BUN <5 (*)    Albumin 3.4 (*)    All other components within normal limits  CBC - Abnormal; Notable for the following:    WBC 13.1 (*)    All other components within normal limits  URINALYSIS, ROUTINE W REFLEX MICROSCOPIC - Abnormal; Notable for the following:    Specific Gravity, Urine 1.003 (*)    Leukocytes, UA LARGE (*)    Bacteria, UA RARE (*)    Squamous Epithelial / LPF 0-5 (*)    All other components within normal limits  URINE CULTURE  LIPASE, BLOOD  POC URINE PREG, ED    EKG  EKG Interpretation None       Radiology Ct Abdomen Pelvis W Contrast  Result Date: 07/12/2017 CLINICAL DATA:  Left flank pain with nausea x3 days. EXAM: CT ABDOMEN AND PELVIS WITH CONTRAST TECHNIQUE:  Multidetector CT imaging of the abdomen and pelvis was performed using the standard protocol following bolus administration of intravenous contrast. CONTRAST:  100 cc ISOVUE-300 IOPAMIDOL (ISOVUE-300) INJECTION 61% COMPARISON:  Pelvic ultrasound 02/11/2014 FINDINGS: Lower chest: No acute abnormality. Hepatobiliary: Cholelithiasis is noted with numerous gallstones measuring up to 2.1 cm. No wall thickening or pericholecystic fluid. No biliary dilatation. Tiny 5 mm right hepatic lobe hypodensity, too small to further characterize but statistically more likely to represent a cyst or hemangioma. Pancreas: Normal Spleen: Normal  Adrenals/Urinary Tract: Normal bilateral adrenal glands. 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 1 cm cyst in the left upper pole with smaller 4 mm hypodensity in the mid to upper pole too small to further characterize post systemic consistent with a cyst. No nephrolithiasis or obstructive uropathy. Stomach/Bowel: No bowel obstruction or inflammation. Moderate fecal residue throughout the colon. Normal appendix. Small nondistended stomach. Vascular/Lymphatic: No significant vascular findings are present. No enlarged abdominal or pelvic lymph nodes. Reproductive: There is a 4.5 x 3.7 x 4.1 cm right ovarian cyst. No mural nodularity or septation. Intrauterine device is noted of the uterus. Normal left ovary. Other: No abdominal wall hernia or abnormality. No abdominopelvic ascites. Musculoskeletal: No acute osseous abnormality. Mild disc space narrowing L5-S1 with broad-based disc bulging. IMPRESSION: 1. 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 No nephrolithiasis or evidence of obstructive uropathy. 2. 1 cm left upper pole renal simple cyst with a smaller 4 mm hypodensity at the junction of the left upper pole and interpolar region. 3. Cholelithiasis without secondary signs of acute cholecystitis. 4. 5 mm nonspecific hypodensity in the right  hepatic lobe too small to further characterize but statistically more likely to represent a cyst or hemangioma. Electronically Signed   By: Ashley Royalty M.D.   On: 07/12/2017 18:05    Procedures Procedures (including critical care time)  Medications Ordered in ED Medications  morphine 4 MG/ML injection 4 mg (4 mg Intravenous Given 07/12/17 1403)  ondansetron (ZOFRAN) injection 4 mg (4 mg Intravenous Given 07/12/17 1403)  sodium chloride 0.9 % bolus 1,000 mL (0 mLs Intravenous Stopped 07/12/17 1837)  morphine 4 MG/ML injection 6 mg (6 mg Intravenous Given 07/12/17 1627)  iopamidol (ISOVUE-300) 61 % injection (100 mLs  Contrast Given 07/12/17 1733)  cefTRIAXone (ROCEPHIN) 1 g in dextrose 5 % 50 mL IVPB (0 g Intravenous Stopped 07/12/17 1918)     Initial Impression / Assessment and Plan / ED Course  I have reviewed the triage vital signs and the nursing notes.  Pertinent labs & imaging results that were available during my care of the patient were reviewed by me and considered in my medical decision making (see chart for details).      Final Clinical Impressions(s) / ED Diagnoses   Final diagnoses:  Acute pyelonephritis  Solitary kidney    Labs: Lipase, CMP, CBC  Imaging: CT abdomen and pelvis  Consults:  Therapeutics: Morphine, Zofran, normal saline  Discharge Meds:   Assessment/Plan: 31 year old female presents today with complaints of left-sided abdominal pain.  Patient although patient denies any acute urinary symptoms, her urinalysis is significant for large leukocytes, too numerous white blood cells and rare bacteria.  Patient has a very minor elevation white blood cell count at 13.1.  She had a CT scan here showing absent right kidney.  No acute findings within the kidney other than simple cysts.  Patient was also noted to have and was informed of all of the above including cholelithiasis and nonspecific hypodensity in the right hepatic lobe.  This is a new diagnosis for  the patient.  They showed no signs of pyelonephritis or complications of the kidney.  She has normal renal function, she will be given a dose of ceftriaxone, discharged on antibiotics, and encouraged follow-up with nephrology.  She is encouraged to return immediately to the emergency room if any new or worsening signs or symptoms present.  Patient verbalized understanding and agreement to today's plan had no further questions or  concerns the time discharge.      New Prescriptions Discharge Medication List as of 07/12/2017  7:29 PM    START taking these medications   Details  ondansetron (ZOFRAN) 4 MG tablet Take 1 tablet (4 mg total) by mouth every 6 (six) hours., Starting Wed 07/12/2017, Print    sulfamethoxazole-trimethoprim (BACTRIM DS,SEPTRA DS) 800-160 MG tablet Take 1 tablet by mouth 2 (two) times daily., Starting Wed 07/12/2017, Until Wed 07/19/2017, Print         Chrysta Fulcher, Lurlean Horns 07/12/17 2038    Tanna Furry, MD 07/24/17 [phone]

## 2017-07-12 NOTE — ED Notes (Signed)
Pt informed of the need of a urine sample. Pt stated she just went to the bathroom before room assignment therefore she is unable to provide sample. Will try again later.

## 2017-07-12 NOTE — ED Triage Notes (Signed)
Pt reports L lateral abd pain, intermittent n/v, x 4 days.  Pt denies diarrhea, dysuria, fevers, reports subjective chills.  Pt tachycardic, oral temp 99.4 in triage.

## 2017-07-12 NOTE — Discharge Instructions (Signed)
Please read attached information. If you experience any new or worsening signs or symptoms please return to the emergency room for evaluation. Please follow-up with your primary care provider or specialist as discussed. Please use medication prescribed only as directed and discontinue taking if you have any concerning signs or symptoms.   °

## 2017-07-12 NOTE — ED Notes (Signed)
Pt verbalized understanding of d/c instructions, no questions, nad.

## 2017-07-12 NOTE — ED Notes (Signed)
Patient transported to CT 

## 2017-07-12 NOTE — ED Notes (Signed)
ED Provider at bedside. 

## 2017-07-14 LAB — URINE CULTURE: Culture: >=100000 — AB

## 2017-07-15 ENCOUNTER — Telehealth: Payer: Self-pay | Admitting: Emergency Medicine

## 2017-07-15 NOTE — Telephone Encounter (Signed)
Post ED Visit - Positive Culture Follow-up  Culture report reviewed by antimicrobial stewardship pharmacist:  []  Elenor Quinones, Pharm.D. []  Heide Guile, Pharm.D., BCPS AQ-ID [x]  Parks Neptune, Pharm.D., BCPS []  Alycia Rossetti, Pharm.D., BCPS []  [place], [place].D., BCPS, AAHIVP []  Legrand Como, Pharm.D., BCPS, AAHIVP []  Salome Arnt, PharmD, BCPS []  Dimitri Ped, PharmD, BCPS []  Vincenza Hews, PharmD, BCPS  Positive urine culture Treated with sulfamethoxazole-trimethoprim, organism sensitive to the same and no further patient follow-up is required at this time.  Hazle Nordmann 07/15/2017, 1:46 PM

## 2017-08-27 ENCOUNTER — Encounter (HOSPITAL_BASED_OUTPATIENT_CLINIC_OR_DEPARTMENT_OTHER): Payer: Self-pay | Admitting: *Deleted

## 2017-08-27 ENCOUNTER — Emergency Department (HOSPITAL_BASED_OUTPATIENT_CLINIC_OR_DEPARTMENT_OTHER)
Admission: EM | Admit: 2017-08-27 | Discharge: 2017-08-28 | Disposition: A | Discharge status: Home / Self Care | Payer: Self-pay | Attending: Emergency Medicine | Admitting: Emergency Medicine

## 2017-08-27 ENCOUNTER — Other Ambulatory Visit: Payer: Self-pay

## 2017-08-27 DIAGNOSIS — Y998 Other external cause status: Secondary | ICD-10-CM | POA: Insufficient documentation

## 2017-08-27 DIAGNOSIS — S060X0A Concussion without loss of consciousness, initial encounter: Secondary | ICD-10-CM | POA: Insufficient documentation

## 2017-08-27 DIAGNOSIS — R51 Headache: Secondary | ICD-10-CM | POA: Insufficient documentation

## 2017-08-27 DIAGNOSIS — F1210 Cannabis abuse, uncomplicated: Secondary | ICD-10-CM | POA: Insufficient documentation

## 2017-08-27 DIAGNOSIS — Z23 Encounter for immunization: Secondary | ICD-10-CM | POA: Insufficient documentation

## 2017-08-27 DIAGNOSIS — Y929 Unspecified place or not applicable: Secondary | ICD-10-CM | POA: Insufficient documentation

## 2017-08-27 DIAGNOSIS — Y9389 Activity, other specified: Secondary | ICD-10-CM | POA: Insufficient documentation

## 2017-08-27 DIAGNOSIS — S0181XA Laceration without foreign body of other part of head, initial encounter: Secondary | ICD-10-CM | POA: Insufficient documentation

## 2017-08-27 DIAGNOSIS — F17210 Nicotine dependence, cigarettes, uncomplicated: Secondary | ICD-10-CM | POA: Insufficient documentation

## 2017-08-27 MED ORDER — LIDOCAINE-EPINEPHRINE 2 %-1:100000 IJ SOLN
20.0000 mL | Freq: Once | INTRAMUSCULAR | Status: AC
  Administered 2017-08-28: 20 mL
  Filled 2017-08-27: qty 1

## 2017-08-27 NOTE — ED Notes (Signed)
ED Provider at bedside. 
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
## 2017-08-28 MED ORDER — IBUPROFEN 400 MG PO TABS
400.0000 mg | ORAL_TABLET | Freq: Once | ORAL | Status: AC | PRN
  Administered 2017-08-28: 400 mg via ORAL
  Filled 2017-08-28: qty 1

## 2017-08-28 MED ORDER — TETANUS-DIPHTH-ACELL PERTUSSIS 5-2.5-18.5 LF-MCG/0.5 IM SUSP
0.5000 mL | Freq: Once | INTRAMUSCULAR | Status: AC
  Administered 2017-08-28: 0.5 mL via INTRAMUSCULAR
  Filled 2017-08-28: qty 0.5
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
## 2018-11-27 ENCOUNTER — Encounter: Payer: Self-pay | Admitting: Internal Medicine

## 2018-11-27 ENCOUNTER — Other Ambulatory Visit: Payer: Self-pay

## 2018-11-27 ENCOUNTER — Inpatient Hospital Stay
Admission: EM | Admit: 2018-11-27 | Discharge: 2018-11-29 | DRG: 872 | Disposition: A | Discharge status: Home / Self Care | Payer: Medicaid Other | Attending: Internal Medicine | Admitting: Internal Medicine

## 2018-11-27 DIAGNOSIS — A419 Sepsis, unspecified organism: Secondary | ICD-10-CM | POA: Diagnosis present

## 2018-11-27 DIAGNOSIS — F329 Major depressive disorder, single episode, unspecified: Secondary | ICD-10-CM | POA: Diagnosis present

## 2018-11-27 DIAGNOSIS — Z79891 Long term (current) use of opiate analgesic: Secondary | ICD-10-CM | POA: Diagnosis not present

## 2018-11-27 DIAGNOSIS — N281 Cyst of kidney, acquired: Secondary | ICD-10-CM | POA: Diagnosis present

## 2018-11-27 DIAGNOSIS — Z880 Allergy status to penicillin: Secondary | ICD-10-CM

## 2018-11-27 DIAGNOSIS — Z791 Long term (current) use of non-steroidal anti-inflammatories (NSAID): Secondary | ICD-10-CM

## 2018-11-27 DIAGNOSIS — R739 Hyperglycemia, unspecified: Secondary | ICD-10-CM | POA: Diagnosis present

## 2018-11-27 DIAGNOSIS — R109 Unspecified abdominal pain: Secondary | ICD-10-CM

## 2018-11-27 DIAGNOSIS — F17210 Nicotine dependence, cigarettes, uncomplicated: Secondary | ICD-10-CM | POA: Diagnosis present

## 2018-11-27 DIAGNOSIS — Q600 Renal agenesis, unilateral: Secondary | ICD-10-CM

## 2018-11-27 DIAGNOSIS — Z833 Family history of diabetes mellitus: Secondary | ICD-10-CM

## 2018-11-27 DIAGNOSIS — Z7982 Long term (current) use of aspirin: Secondary | ICD-10-CM | POA: Diagnosis not present

## 2018-11-27 DIAGNOSIS — Z8249 Family history of ischemic heart disease and other diseases of the circulatory system: Secondary | ICD-10-CM | POA: Diagnosis not present

## 2018-11-27 DIAGNOSIS — Z79899 Other long term (current) drug therapy: Secondary | ICD-10-CM | POA: Diagnosis not present

## 2018-11-27 DIAGNOSIS — N12 Tubulo-interstitial nephritis, not specified as acute or chronic: Secondary | ICD-10-CM | POA: Diagnosis present

## 2018-11-27 DIAGNOSIS — N390 Urinary tract infection, site not specified: Secondary | ICD-10-CM | POA: Diagnosis present

## 2018-11-27 DIAGNOSIS — Z809 Family history of malignant neoplasm, unspecified: Secondary | ICD-10-CM | POA: Diagnosis not present

## 2018-11-27 LAB — COMPREHENSIVE METABOLIC PANEL
ALT: 13 U/L (ref 0–44)
AST: 20 U/L (ref 15–41)
Albumin: 3.9 g/dL (ref 3.5–5.0)
Alkaline Phosphatase: 55 U/L (ref 38–126)
Anion gap: 11 (ref 5–15)
BUN: 6 mg/dL (ref 6–20)
CO2: 23 mmol/L (ref 22–32)
CREATININE: 0.61 mg/dL (ref 0.44–1.00)
Calcium: 8.6 mg/dL — ABNORMAL LOW (ref 8.9–10.3)
Chloride: 101 mmol/L (ref 98–111)
GFR calc Af Amer: >60 mL/min (ref >60)
GFR calc non Af Amer: >60 mL/min (ref >60)
Glucose, Bld: 119 mg/dL — ABNORMAL HIGH (ref 70–99)
Potassium: 3.5 mmol/L (ref 3.5–5.1)
Sodium: 135 mmol/L (ref 135–145)
Total Bilirubin: 0.6 mg/dL (ref 0.3–1.2)
Total Protein: 7 g/dL (ref 6.5–8.1)

## 2018-11-27 LAB — CBC
HCT: 40.3 % (ref 36.0–46.0)
Hemoglobin: 13.8 g/dL (ref 12.0–15.0)
MCH: 32.9 pg (ref 26.0–34.0)
MCHC: 34.2 g/dL (ref 30.0–36.0)
MCV: 96 fL (ref 80.0–100.0)
Platelets: 227 10*3/uL (ref 150–400)
RBC: 4.2 MIL/uL (ref 3.87–5.11)
RDW: 13 % (ref 11.5–15.5)
WBC: 12.9 10*3/uL — ABNORMAL HIGH (ref 4.0–10.5)
nRBC: 0 % (ref 0.0–0.2)

## 2018-11-27 LAB — URINALYSIS, COMPLETE (UACMP) WITH MICROSCOPIC
BILIRUBIN URINE: NEGATIVE
Bilirubin Urine: NEGATIVE
Glucose, UA: NEGATIVE mg/dL
Glucose, UA: NEGATIVE mg/dL
Hgb urine dipstick: NEGATIVE
Hgb urine dipstick: NEGATIVE
Ketones, ur: NEGATIVE mg/dL
Ketones, ur: NEGATIVE mg/dL
Leukocytes,Ua: NEGATIVE
Leukocytes,Ua: NEGATIVE
Nitrite: NEGATIVE
Nitrite: NEGATIVE
Protein, ur: NEGATIVE mg/dL
Protein, ur: NEGATIVE mg/dL
SPECIFIC GRAVITY, URINE: 1.01 (ref 1.005–1.030)
Specific Gravity, Urine: 1.01 (ref 1.005–1.030)
pH: 7 (ref 5.0–8.0)
pH: 7 (ref 5.0–8.0)

## 2018-11-27 LAB — POCT PREGNANCY, URINE: Preg Test, Ur: NEGATIVE

## 2018-11-27 LAB — LACTIC ACID, PLASMA: Lactic Acid, Venous: 1.1 mmol/L (ref 0.5–1.9)

## 2018-11-27 MED ORDER — ACETAMINOPHEN 325 MG PO TABS
ORAL_TABLET | ORAL | Status: AC
  Administered 2018-11-27: 650 mg
  Filled 2018-11-27: qty 2

## 2018-11-27 MED ORDER — ACETAMINOPHEN 325 MG PO TABS
650.0000 mg | ORAL_TABLET | Freq: Four times a day (QID) | ORAL | Status: DC | PRN
  Administered 2018-11-28: 04:00:00 650 mg via ORAL
  Filled 2018-11-27: qty 2

## 2018-11-27 MED ORDER — SODIUM CHLORIDE 0.9 % IV SOLN
1.0000 g | INTRAVENOUS | Status: DC
  Administered 2018-11-28: 1 g via INTRAVENOUS
  Filled 2018-11-27: qty 10
  Filled 2018-11-27: qty 1

## 2018-11-27 MED ORDER — SENNOSIDES-DOCUSATE SODIUM 8.6-50 MG PO TABS: 1.0000 | ORAL_TABLET | Freq: Every evening | ORAL | Status: DC | PRN

## 2018-11-27 MED ORDER — SODIUM CHLORIDE 0.9 % IV BOLUS
1000.0000 mL | Freq: Once | INTRAVENOUS | Status: AC
  Administered 2018-11-27: 1000 mL via INTRAVENOUS

## 2018-11-27 MED ORDER — ONDANSETRON HCL 4 MG PO TABS
4.0000 mg | ORAL_TABLET | Freq: Four times a day (QID) | ORAL | Status: DC | PRN
  Administered 2018-11-29: 4 mg via ORAL
  Filled 2018-11-27: qty 1

## 2018-11-27 MED ORDER — BISACODYL 5 MG PO TBEC: 5.0000 mg | DELAYED_RELEASE_TABLET | Freq: Every day | ORAL | Status: DC | PRN

## 2018-11-27 MED ORDER — ENOXAPARIN SODIUM 40 MG/0.4ML ~~LOC~~ SOLN
40.0000 mg | SUBCUTANEOUS | Status: DC
  Administered 2018-11-27 – 2018-11-29 (×2): 40 mg via SUBCUTANEOUS
  Filled 2018-11-27 (×2): qty 0.4

## 2018-11-27 MED ORDER — BUTALBITAL-APAP-CAFFEINE 50-325-40 MG PO TABS
1.0000 | ORAL_TABLET | Freq: Four times a day (QID) | ORAL | Status: DC | PRN
  Administered 2018-11-28 – 2018-11-29 (×4): 1 via ORAL
  Filled 2018-11-27 (×8): qty 1

## 2018-11-27 MED ORDER — LACTATED RINGERS IV SOLN
INTRAVENOUS | Status: AC
  Administered 2018-11-27 – 2018-11-28 (×2): via INTRAVENOUS

## 2018-11-27 MED ORDER — FENTANYL CITRATE (PF) 100 MCG/2ML IJ SOLN
50.0000 ug | Freq: Once | INTRAMUSCULAR | Status: AC
  Administered 2018-11-27: 50 ug via INTRAVENOUS
  Filled 2018-11-27: qty 2

## 2018-11-27 MED ORDER — KETOROLAC TROMETHAMINE 30 MG/ML IJ SOLN
30.0000 mg | Freq: Once | INTRAMUSCULAR | Status: AC
  Administered 2018-11-27: 30 mg via INTRAVENOUS
  Filled 2018-11-27: qty 1

## 2018-11-27 MED ORDER — HYDROMORPHONE HCL 1 MG/ML IJ SOLN
0.5000 mg | INTRAMUSCULAR | Status: DC | PRN
  Administered 2018-11-27 – 2018-11-28 (×2): 0.5 mg via INTRAVENOUS
  Filled 2018-11-27 (×2): qty 1

## 2018-11-27 MED ORDER — POLYVINYL ALCOHOL 1.4 % OP SOLN
1.0000 [drp] | OPHTHALMIC | Status: DC | PRN
  Filled 2018-11-27: qty 15

## 2018-11-27 MED ORDER — ACETAMINOPHEN 650 MG RE SUPP: 650.0000 mg | Freq: Four times a day (QID) | RECTAL | Status: DC | PRN

## 2018-11-27 MED ORDER — ACETAMINOPHEN 325 MG PO TABS: 650.0000 mg | ORAL_TABLET | Freq: Once | ORAL | Status: DC

## 2018-11-27 MED ORDER — POTASSIUM CHLORIDE 20 MEQ PO PACK
40.0000 meq | PACK | Freq: Once | ORAL | Status: AC
  Administered 2018-11-27: 40 meq via ORAL
  Filled 2018-11-27: qty 2

## 2018-11-27 MED ORDER — SODIUM CHLORIDE 0.9 % IV SOLN
1.0000 g | Freq: Once | INTRAVENOUS | Status: AC
  Administered 2018-11-27: 1 g via INTRAVENOUS
  Filled 2018-11-27: qty 10

## 2018-11-27 MED ORDER — ONDANSETRON HCL 4 MG/2ML IJ SOLN
4.0000 mg | Freq: Once | INTRAMUSCULAR | Status: AC
  Administered 2018-11-27: 4 mg via INTRAVENOUS
  Filled 2018-11-27: qty 2

## 2018-11-27 MED ORDER — ONDANSETRON HCL 4 MG/2ML IJ SOLN
4.0000 mg | Freq: Four times a day (QID) | INTRAMUSCULAR | Status: DC | PRN
  Administered 2018-11-28: 4 mg via INTRAVENOUS
  Filled 2018-11-27: qty 2

## 2018-11-27 NOTE — ED Notes (Signed)
Resumed care from [place] rn.  primedoc in with pt.  Pt alert.  Pt waiting on admission.  nsr on monitor.  Skin warm and dry.

## 2018-11-27 NOTE — H&P (Signed)
[facility] NAME: Lauren Mcmahon    MR#:  [id number]  DATE OF BIRTH:  06/24/1986  DATE OF ADMISSION:  11/27/2018  PRIMARY CARE PHYSICIAN: System, Pcp Not In   REQUESTING/REFERRING PHYSICIAN: Nance Pear, MD  CHIEF COMPLAINT:   Chief Complaint  Patient presents with  . Abdominal Pain    HISTORY OF PRESENT ILLNESS:  Lauren Mcmahon  is a 33 y.o. female with a known history of migraine HA, congenitally absent R kidney, p/w L flank/back/abdominal pain, fever, leukocytosis, SIRS, sepsis 2/2 UTI, suspected L pyelonephritis. Endorses 3-4d Hx pain throughout "my whole left side" (L abdomen, L flank, L low/mid back). Endorses fever, chills and diaphoresis. (-) rigors. Endorses increased urinary frequency and malodorous urine, but denies pain, dysuria, burning, urgency, hesitancy, dribbling, nocturia, incontinence, gross hematuria/pyuria. Endorses nausea throughout the day (Tuesday 11/27/2018), vomiting x1 en route to ED. Endorses frontal headache w/o photophobia/phonophobia. Denies diarrhea. States symptoms have been progressive, but were severe since Monday (11/26/2018) evening, and reportedly steadily worsening throughout the day Tuesday (11/27/2018). WBC 12.9, T 38.4C, tachycardic/tachypneic, SIRS (+) in ED. U/A (+) rare bacteria, 6-10 WBC, (-) nitrite, (-) leukocyte esterase. Cr 0.61, at baseline; low suspicion for obstructive uropathy (pt congenitally absent R kidney; L-sided obstruction expected to precipitate renal failure). Pt appears well, endorses improvement since coming to ED.  PAST MEDICAL HISTORY:   Past Medical History:  Diagnosis Date  . Arthritis    deg disk disease - lower back  . Depression    hx - no meds  . Fibroid   . Heartburn in pregnancy    tx with tums  . PONV (postoperative nausea and vomiting)     PAST SURGICAL HISTORY:   Past Surgical History:  Procedure Laterality Date  . CESAREAN SECTION  2009    x 1  . CESAREAN SECTION  06/11/2012   Procedure: CESAREAN SECTION;  Surgeon: Logan Bores, MD;  Location: [facility];  Service: Obstetrics;  Laterality: N/A;  Repeat cesarean section with delivery of baby girl at 75. Apgars 8/9.  Marland Kitchen WISDOM TOOTH EXTRACTION      SOCIAL HISTORY:   Social History   Tobacco Use  . Smoking status: Current Every Day Smoker    Packs/day: 0.50    Years: 10.00    Pack years: 5.00    Types: Cigarettes  . Smokeless tobacco: Never Used  Substance Use Topics  . Alcohol use: Yes    Comment: rarely    FAMILY HISTORY:   Family History  Problem Relation Age of Onset  . Hypertension Mother   . Cancer Maternal Grandmother   . Heart disease Maternal Grandfather   . Diabetes Paternal Grandfather   . Hypertension Paternal Grandfather   . Anesthesia problems Neg Hx     DRUG ALLERGIES:   Allergies  Allergen Reactions  . Penicillins Nausea Only and Rash    REVIEW OF SYSTEMS:   Review of Systems  Constitutional: Positive for chills, diaphoresis and fever. Negative for malaise/fatigue and weight loss.  HENT: Negative for congestion, ear pain, hearing loss, nosebleeds, sinus pain, sore throat and tinnitus.   Eyes: Negative for blurred vision, double vision and photophobia.  Respiratory: Negative for cough, hemoptysis, sputum production, shortness of breath and wheezing.   Cardiovascular: Negative for chest pain, palpitations, orthopnea, claudication, leg swelling and PND.  Gastrointestinal: Positive for abdominal pain, nausea and vomiting. Negative for blood in stool, constipation, diarrhea, heartburn and melena.  Genitourinary: Positive for  flank pain and frequency. Negative for dysuria, hematuria and urgency.  Musculoskeletal: Positive for back pain. Negative for falls, joint pain, myalgias and neck pain.  Skin: Negative for itching and rash.  Neurological: Positive for headaches. Negative for dizziness, tingling, tremors, sensory change, speech  change, focal weakness, seizures, loss of consciousness and weakness.  Psychiatric/Behavioral: Negative for depression and memory loss. The patient is not nervous/anxious and does not have insomnia.    MEDICATIONS AT HOME:   Prior to Admission medications   Medication Sig Start Date End Date Taking? Authorizing Provider  Aspirin-Acetaminophen-Caffeine (GOODY HEADACHE PO) Take 1 packet by mouth daily as needed (For headache.).    [provider]  calcium carbonate (TUMS - DOSED IN MG ELEMENTAL CALCIUM) 500 MG chewable tablet Chew 1 tablet by mouth daily as needed. For heartburn    [provider]  doxycycline (VIBRAMYCIN) 100 MG capsule Take 1 capsule (100 mg total) by mouth 2 (two) times daily. 02/11/14   Fransico Meadow, PA-C  HYDROcodone-acetaminophen (NORCO/VICODIN) 5-325 MG per tablet Take 2 tablets by mouth every 4 (four) hours as needed. 02/11/14   Fransico Meadow, PA-C  ibuprofen (ADVIL,MOTRIN) 200 MG tablet Take 400 mg by mouth every 6 (six) hours as needed for mild pain.    [provider]  metroNIDAZOLE (FLAGYL) 500 MG tablet Take 1 tablet (500 mg total) by mouth 2 (two) times daily. 02/11/14   Fransico Meadow, PA-C  ondansetron (ZOFRAN) 4 MG tablet Take 1 tablet (4 mg total) by mouth every 6 (six) hours. 07/12/17   Hedges, Dellis Filbert, PA-C  OVER THE COUNTER MEDICATION Place 1 drop into both eyes daily as needed (For dryness.). Patient uses Rohto Cool eye drops.    [provider]  oxyCODONE-acetaminophen (PERCOCET/ROXICET) 5-325 MG tablet Take 2 tablets by mouth every 4 (four) hours as needed for severe pain. 07/12/17   Hedges, Dellis Filbert, PA-C      VITAL SIGNS:  Blood pressure 95/70, pulse 89, temperature 98 F (36.7 C), temperature source Oral, resp. rate 14, height 5\' 7"  (1.702 m), weight 74.8 kg, last menstrual period 11/06/2018, SpO2 99 %.  PHYSICAL EXAMINATION:  Physical Exam Constitutional:      General: She is not in acute distress.    Appearance:  She is ill-appearing. She is not toxic-appearing or diaphoretic.     Interventions: She is not intubated. HENT:     Head: Atraumatic.     Mouth/Throat:     Pharynx: Oropharynx is clear.  Eyes:     General: No scleral icterus.    Extraocular Movements: Extraocular movements intact.     Conjunctiva/sclera: Conjunctivae normal.  Neck:     Musculoskeletal: Neck supple.  Cardiovascular:     Rate and Rhythm: Regular rhythm. Tachycardia present.  No extrasystoles are present.    Heart sounds: Normal heart sounds, S1 normal and S2 normal. Heart sounds not distant. No murmur. No friction rub. No gallop. No S3 or S4 sounds.   Pulmonary:     Effort: No tachypnea, bradypnea, accessory muscle usage, prolonged expiration, respiratory distress or retractions. She is not intubated.     Breath sounds: Normal breath sounds. No stridor, decreased air movement or transmitted upper airway sounds. No decreased breath sounds, wheezing, rhonchi or rales.  Abdominal:     General: Bowel sounds are decreased. There is no distension.     Palpations: Abdomen is soft.     Tenderness: There is abdominal tenderness in the left upper quadrant and left lower quadrant.  There is left CVA tenderness. There is no guarding or rebound.  Musculoskeletal: Normal range of motion.        General: No swelling or tenderness.     Right lower leg: No edema.     Left lower leg: No edema.  Lymphadenopathy:     Cervical: No cervical adenopathy.  Skin:    General: Skin is warm and dry.     Findings: No erythema or rash.  Neurological:     General: No focal deficit present.     Mental Status: She is alert and oriented to person, place, and time. Mental status is at baseline.  Psychiatric:        Attention and Perception: Attention and perception normal.        Mood and Affect: Mood normal.        Speech: Speech normal.        Behavior: Behavior normal. Behavior is cooperative.        Thought Content: Thought content normal.         Cognition and Memory: Cognition and memory normal.        Judgment: Judgment normal.    LABORATORY PANEL:   CBC Recent Labs  Lab 11/27/18 1926  WBC 12.9*  HGB 13.8  HCT 40.3  PLT 227   ------------------------------------------------------------------------------------------------------------------  Chemistries  Recent Labs  Lab 11/27/18 1926  NA 135  K 3.5  CL 101  CO2 23  GLUCOSE 119*  BUN 6  CREATININE 0.61  CALCIUM 8.6*  AST 20  ALT 13  ALKPHOS 55  BILITOT 0.6   ------------------------------------------------------------------------------------------------------------------  Cardiac Enzymes No results for input(s): TROPONINI in the last 168 hours. ------------------------------------------------------------------------------------------------------------------  RADIOLOGY:  No results found.  IMPRESSION AND PLAN:   A/P: 15F w/ PMHx migraine HA, congenitally absent R kidney, p/w L flank/back/abdominal pain, fever, leukocytosis, SIRS, sepsis 2/2 UTI, suspected L pyelonephritis. Hyperglycemia, hypocalcemia. -L flank/back/abdominal pain, fever, leukocytosis, SIRS, sepsis 2/2 UTI, suspected L pyelonephritis: WBC 12.9, T 38.4C, tachycardic/tachypneic, SIRS (+) in ED. U/A (+) rare bacteria, 6-10 WBC, (-) nitrite, (-) leukocyte esterase. Prior CT imaging (from 06/2017) demonstrated, "Absent right kidney with compensatory hypertrophy of the left kidney. No nephrolithiasis or evidence of obstructive uropathy. 1 cm left upper pole renal simple cyst with a smaller 4 mm hypodensity at the junction of the left upper pole and interpolar region." Cr 0.61, at baseline; low suspicion for obstructive uropathy, as L-sided obstruction expected to precipitate renal failure. L renal U/S pending nonetheless. Lac 1.1, PCT pending. Ceftriaxone. IVF. Pain ctrl. -Hypocalcemia: Ionized calcium. -c/w other home meds/formulary subs as tolerated/appropriate. -FEN/GI: Regular diet as tolerated.  -DVT PPx: Lovenox. -Code status: Full code. -Disposition: Admission, > 2 midnights.   All the records are reviewed and case discussed with ED provider. Management plans discussed with the patient, family and they are in agreement.  CODE STATUS: Full code.  TOTAL TIME TAKING CARE OF THIS PATIENT: 75 minutes.    Arta Silence M.D on 11/27/2018 at 11:00 PM  Between 7am to 6pm - Pager - [phone]  After 6pm go to [email or web address] - Proofreader  [facility]  Office  [phone]  CC: Primary care physician; System, Pcp Not In   Note: This dictation was prepared with Dragon dictation along with smaller phrase technology. Any transcriptional errors that result from this process are unintentional.

## 2018-11-27 NOTE — ED Triage Notes (Signed)
Pt in with co left flank pain that started 2-3 days ago. Has had urinary frequency and urgency. 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

## 2018-11-27 NOTE — ED Notes (Signed)
ED TO INPATIENT HANDOFF REPORT  ED Nurse Name and Phone #: Metta Clines [phone]  S Name/Age/Gender Lauren Mcmahon 33 y.o. female Room/Bed: ED18A/ED18A  Code Status   Code Status: Prior  Home/SNF/Other Home Patient oriented to: self, place, time and situation Is this baseline? Yes   Triage Complete: Triage complete  Chief Complaint lower abd and side pain  Triage Note Pt in with co left flank pain that started 2-3 days ago. Has had urinary frequency and urgency. 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    Allergies Allergies  Allergen Reactions  . Penicillins Nausea Only and Rash    Level of Care/Admitting Diagnosis ED Disposition    ED Disposition Condition [facility] Area: [place] [[id number]]  Level of Care: Med-Surg [16]  Diagnosis: Sepsis secondary to UTI [facility]) [[id number]]  Admitting Physician: Arta Silence [[id number]]  Attending Physician: Arta Silence [[id number]]  Estimated length of stay: past midnight tomorrow  Certification:: I certify this patient will need inpatient services for at least 2 midnights  PT Class (Do Not Modify): Inpatient [101]  PT Acc Code (Do Not Modify): Private [1]       B Medical/Surgery History Past Medical History:  Diagnosis Date  . Arthritis    deg disk disease - lower back  . Depression    hx - no meds  . Fibroid   . Heartburn in pregnancy    tx with tums  . PONV (postoperative nausea and vomiting)    Past Surgical History:  Procedure Laterality Date  . CESAREAN SECTION  2009   x 1  . CESAREAN SECTION  06/11/2012   Procedure: CESAREAN SECTION;  Surgeon: Logan Bores, MD;  Location: [facility];  Service: Obstetrics;  Laterality: N/A;  Repeat cesarean section with delivery of baby girl at 55. Apgars 8/9.  Marland Kitchen WISDOM TOOTH EXTRACTION       A IV Location/Drains/Wounds Patient Lines/Drains/Airways Status   Active Line/Drains/Airways    Name:   Placement date:   Placement time:    Site:   Days:   Peripheral IV 11/27/18 Left Forearm   11/27/18    2054    Forearm   less than 1   Wound / Incision (Open or Dehisced) 08/27/17 Laceration Head Left;Upper 1 inch gaping laceration to forehead.    08/27/17    2244    Head   457          Intake/Output Last 24 hours No intake or output data in the 24 hours ending 11/27/18 2145  Labs/Imaging Results for orders placed or performed during the hospital encounter of 11/27/18 (from the past 48 hour(s))  Urinalysis, Complete w Microscopic     Status: Abnormal   Collection Time: 11/27/18  7:25 PM  Result Value Ref Range   Color, Urine YELLOW (A) YELLOW   APPearance CLEAR (A) CLEAR   Specific Gravity, Urine 1.010 1.005 - 1.030   pH 7.0 5.0 - 8.0   Glucose, UA NEGATIVE NEGATIVE mg/dL   Hgb urine dipstick NEGATIVE NEGATIVE   Bilirubin Urine NEGATIVE NEGATIVE   Ketones, ur NEGATIVE NEGATIVE mg/dL   Protein, ur NEGATIVE NEGATIVE mg/dL   Nitrite NEGATIVE NEGATIVE   Leukocytes,Ua NEGATIVE NEGATIVE   RBC / HPF 0-5 0 - 5 RBC/hpf   WBC, UA 6-10 0 - 5 WBC/hpf   Bacteria, UA RARE (A) NONE SEEN   Squamous Epithelial / LPF 0-5 0 - 5   Mucus PRESENT     Comment: Performed at  [facility], [address] [place], [place] [address]  CBC     Status: Abnormal   Collection Time: 11/27/18  7:26 PM  Result Value Ref Range   WBC 12.9 (H) 4.0 - 10.5 K/uL   RBC 4.20 3.87 - 5.11 MIL/uL   Hemoglobin 13.8 12.0 - 15.0 g/dL   HCT 40.3 36.0 - 46.0 %   MCV 96.0 80.0 - 100.0 fL   MCH 32.9 26.0 - 34.0 pg   MCHC 34.2 30.0 - 36.0 g/dL   RDW 13.0 11.5 - 15.5 %   Platelets 227 150 - 400 K/uL   nRBC 0.0 0.0 - 0.2 %    Comment: Performed at [facility], [address] [place], [place] [address]  Comprehensive metabolic panel     Status: Abnormal   Collection Time: 11/27/18  7:26 PM  Result Value Ref Range   Sodium 135 135 - 145 mmol/L   Potassium 3.5 3.5 - 5.1 mmol/L   Chloride 101 98 - 111 mmol/L   CO2 23 22 - 32 mmol/L    Glucose, Bld 119 (H) 70 - 99 mg/dL   BUN 6 6 - 20 mg/dL   Creatinine, Ser 0.61 0.44 - 1.00 mg/dL   Calcium 8.6 (L) 8.9 - 10.3 mg/dL   Total Protein 7.0 6.5 - 8.1 g/dL   Albumin 3.9 3.5 - 5.0 g/dL   AST 20 15 - 41 U/L   ALT 13 0 - 44 U/L   Alkaline Phosphatase 55 38 - 126 U/L   Total Bilirubin 0.6 0.3 - 1.2 mg/dL   GFR calc non Af Amer >60 >60 mL/min   GFR calc Af Amer >60 >60 mL/min   Anion gap 11 5 - 15    Comment: Performed at [facility], [address] [place], [place] [address]  Urinalysis, Complete w Microscopic     Status: Abnormal   Collection Time: 11/27/18  7:26 PM  Result Value Ref Range   Color, Urine YELLOW (A) YELLOW   APPearance CLEAR (A) CLEAR   Specific Gravity, Urine 1.010 1.005 - 1.030   pH 7.0 5.0 - 8.0   Glucose, UA NEGATIVE NEGATIVE mg/dL   Hgb urine dipstick NEGATIVE NEGATIVE   Bilirubin Urine NEGATIVE NEGATIVE   Ketones, ur NEGATIVE NEGATIVE mg/dL   Protein, ur NEGATIVE NEGATIVE mg/dL   Nitrite NEGATIVE NEGATIVE   Leukocytes,Ua NEGATIVE NEGATIVE    Comment: Performed at [facility], [address] [place], [place] [address]  Pregnancy, urine POC     Status: None   Collection Time: 11/27/18  7:28 PM  Result Value Ref Range   Preg Test, Ur NEGATIVE NEGATIVE    Comment:        THE SENSITIVITY OF THIS METHODOLOGY IS >24 mIU/mL   Lactic acid, plasma     Status: None   Collection Time: 11/27/18  9:12 PM  Result Value Ref Range   Lactic Acid, Venous 1.1 0.5 - 1.9 mmol/L    Comment: Performed at [facility], [address] [place],  [address]   No results found.  Pending Labs Unresulted Labs (From admission, onward)    Start     Ordered   11/27/18 2144  Procalcitonin - Baseline  Add-on,   AD     11/27/18 2143   11/27/18 2143  Magnesium  Add-on,   AD    Question:  Specimen collection method  Answer:  Unit=Unit collect   11/27/18 2143   11/27/18 2143  Phosphorus  Add-on,   AD    Question:  Specimen  collection method  Answer:  Unit=Unit collect   11/27/18 2143   11/27/18 2143  Calcium, ionized  Once,   STAT    Question:  Specimen collection method  Answer:  Unit=Unit collect   11/27/18 2143          Vitals/Pain Today's Vitals   11/27/18 2115 11/27/18 2129 11/27/18 2130 11/27/18 2138  BP:   117/71   Pulse:  (!) 104 97   Resp: (!) 23 (!) 21 (!) 22   Temp:      TempSrc:      SpO2:  99% 98%   Weight:      Height:      PainSc:    5     Isolation Precautions No active isolations  Medications Medications  acetaminophen (TYLENOL) tablet 650 mg (650 mg Oral Not Given 11/27/18 1957)  cefTRIAXone (ROCEPHIN) 1 g in sodium chloride 0.9 % 100 mL IVPB (1 g Intravenous New Bag/Given 11/27/18 2143)  sodium chloride 0.9 % bolus 1,000 mL (has no administration in time range)  HYDROmorphone (DILAUDID) injection 0.5 mg (has no administration in time range)  cefTRIAXone (ROCEPHIN) 1 g in sodium chloride 0.9 % 100 mL IVPB (has no administration in time range)  lactated ringers infusion (has no administration in time range)  potassium chloride (KLOR-CON) packet 40 mEq (has no administration in time range)  acetaminophen (TYLENOL) 325 MG tablet (650 mg  Given by Other 11/27/18 1933)  sodium chloride 0.9 % bolus 1,000 mL (0 mLs Intravenous Stopped 11/27/18 2140)  fentaNYL (SUBLIMAZE) injection 50 mcg (50 mcg Intravenous Given 11/27/18 2106)  ondansetron (ZOFRAN) injection 4 mg (4 mg Intravenous Given 11/27/18 2104)  ketorolac (TORADOL) 30 MG/ML injection 30 mg (30 mg Intravenous Given 11/27/18 2139)    Mobility walks Low fall risk   Focused Assessments   GI: only abnormality is pain & urine sample results     R Recommendations: See Admitting Provider Note  Report given to:   Additional Notes:  L fa IV; 1 kidney; rocephin running now d/t urine results; L flank pain; Ketoralac/Tylenol/Fentanyl given. Bolus x2 NS given. Pain dec some.

## 2018-11-27 NOTE — ED Notes (Signed)
Pt given blankets

## 2018-11-27 NOTE — ED Notes (Signed)
Pt given phone charger from boyfriend in lobby.

## 2018-11-27 NOTE — ED Notes (Signed)
Report called to [name]

## 2018-11-27 NOTE — ED Provider Notes (Signed)
[facility] Emergency Department Provider Note   ____________________________________________   I have reviewed the triage vital signs and the nursing notes.   HISTORY  Chief Complaint Abdominal Pain   History limited by: Not Limited   HPI Lauren Mcmahon is a 33 y.o. female who presents to the emergency department today because of concern for left flank pain. The pain started three days ago. It has gradually gotten worse. The patient states that the pain is severe. She has noticed some increase in frequency of urination and bad odor to her urine. States that she had similar pain in the past when she was diagnosed with a kidney infection. She just started to have fevers today. The patient has had associated nausea and vomiting. 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   Records reviewed. Per medical record review patient has a history of acute pyelonephritis.   Past Medical History:  Diagnosis Date  . Arthritis    deg disk disease - lower back  . Depression    hx - no meds  . Fibroid   . Heartburn in pregnancy    tx with tums  . PONV (postoperative nausea and vomiting)     There are no active problems to display for this patient.   Past Surgical History:  Procedure Laterality Date  . CESAREAN SECTION  2009   x 1  . CESAREAN SECTION  06/11/2012   Procedure: CESAREAN SECTION;  Surgeon: Logan Bores, MD;  Location: [facility];  Service: Obstetrics;  Laterality: N/A;  Repeat cesarean section with delivery of baby girl at 6. Apgars 8/9.  Marland Kitchen WISDOM TOOTH EXTRACTION      Prior to Admission medications   Medication Sig Start Date End Date Taking? Authorizing Provider  Aspirin-Acetaminophen-Caffeine (GOODY HEADACHE PO) Take 1 packet by mouth daily as needed (For headache.).    [provider]  calcium carbonate (TUMS - DOSED IN MG ELEMENTAL CALCIUM) 500 MG chewable tablet Chew 1 tablet by mouth daily as needed. For heartburn    [provider]  doxycycline (VIBRAMYCIN) 100 MG capsule Take 1 capsule (100 mg total) by mouth 2 (two) times daily. 02/11/14   Fransico Meadow, PA-C  HYDROcodone-acetaminophen (NORCO/VICODIN) 5-325 MG per tablet Take 2 tablets by mouth every 4 (four) hours as needed. 02/11/14   Fransico Meadow, PA-C  ibuprofen (ADVIL,MOTRIN) 200 MG tablet Take 400 mg by mouth every 6 (six) hours as needed for mild pain.    [provider]  metroNIDAZOLE (FLAGYL) 500 MG tablet Take 1 tablet (500 mg total) by mouth 2 (two) times daily. 02/11/14   Fransico Meadow, PA-C  ondansetron (ZOFRAN) 4 MG tablet Take 1 tablet (4 mg total) by mouth every 6 (six) hours. 07/12/17   Hedges, Dellis Filbert, PA-C  OVER THE COUNTER MEDICATION Place 1 drop into both eyes daily as needed (For dryness.). Patient uses Rohto Cool eye drops.    [provider]  oxyCODONE-acetaminophen (PERCOCET/ROXICET) 5-325 MG tablet Take 2 tablets by mouth every 4 (four) hours as needed for severe pain. 07/12/17   Hedges, Dellis Filbert, PA-C    Allergies Penicillins  Family History  Problem Relation Age of Onset  . Hypertension Mother   . Cancer Maternal Grandmother   . Heart disease Maternal Grandfather   . Diabetes Paternal Grandfather   . Hypertension Paternal Grandfather   . Anesthesia problems Neg Hx     Social History Social History   Tobacco Use  . Smoking status: Current Every  Day Smoker    Packs/day: 0.50    Years: 10.00    Pack years: 5.00    Types: Cigarettes  . Smokeless tobacco: Never Used  Substance Use Topics  . Alcohol use: Yes    Comment: rarely  . Drug use: Yes    Types: Marijuana    Comment: 3-4 times a week    Review of Systems Constitutional: No fever/chills Eyes: No visual changes. ENT: No sore throat. Cardiovascular: Denies chest pain. Respiratory: Denies shortness of breath. Gastrointestinal: Positive for left flank pain.  Genitourinary: Negative for dysuria. Positive for bad odor and increased  frequency. Musculoskeletal: Negative for back pain. Skin: Negative for rash. Neurological: Negative for headaches, focal weakness or numbness.  ____________________________________________   PHYSICAL EXAM:  VITAL SIGNS: ED Triage Vitals [11/27/18 1923]  Enc Vitals Group     BP 124/68     Pulse Rate (!) 136     Resp 20     Temp (!) 101.1 F (38.4 C)     Temp Source Oral     SpO2 98 %     Weight 165 lb (74.8 kg)     Height 5\' 7"  (1.702 m)     Head Circumference      Peak Flow      Pain Score 8   Constitutional: Alert and oriented.  Eyes: Conjunctivae are normal.  ENT      Head: Normocephalic and atraumatic.      Nose: No congestion/rhinnorhea.      Mouth/Throat: Mucous membranes are moist.      Neck: No stridor. Hematological/Lymphatic/Immunilogical: No cervical lymphadenopathy. Cardiovascular: Tachycardic, regular rhythm.  No murmurs, rubs, or gallops.  Respiratory: Normal respiratory effort without tachypnea nor retractions. Breath sounds are clear and equal bilaterally. No wheezes/rales/rhonchi. Gastrointestinal: Soft and non tender. Positive for left CVA tenderness.  Genitourinary: Deferred Musculoskeletal: Normal range of motion in all extremities. No lower extremity edema. Neurologic:  Normal speech and language. No gross focal neurologic deficits are appreciated.  Skin:  Skin is warm, dry and intact. No rash noted. Psychiatric: Mood and affect are normal. Speech and behavior are normal. Patient exhibits appropriate insight and judgment.  ____________________________________________    LABS (pertinent positives/negatives)  Upreg negative Lactic 1.1 CMP na 135, k 3.5, glu 119, ca 8.6 CBC wbc 12.9, hgb 13.8, plt 227 UA clear, pH 7.0 ____________________________________________   EKG  None  ____________________________________________     RADIOLOGY  None  ____________________________________________   PROCEDURES  Procedures  ____________________________________________   INITIAL IMPRESSION / ASSESSMENT AND PLAN / ED COURSE  Pertinent labs & imaging results that were available during my care of the patient were reviewed by me and considered in my medical decision making (see chart for details).   Patient presents to the emergency department because of concern for left flank pain and possible kidney infection which she has had in the past. On exam patient does have CVA tenderness. Initial vital signs concerning for infection given fever and tachycardia. UA does have some bacteria and WBCs consistent with infection. Given that patient has only one kidney will plan on admission. Discussed findings and plan with patient.   ____________________________________________   FINAL CLINICAL IMPRESSION(S) / ED DIAGNOSES  Final diagnoses:  Left flank pain  Pyelonephritis     Note: This dictation was prepared with Dragon dictation. Any transcriptional errors that result from this process are unintentional     Nance Pear, MD 11/27/18 2207

## 2018-11-28 ENCOUNTER — Inpatient Hospital Stay: Payer: Medicaid Other

## 2018-11-28 LAB — MAGNESIUM: Magnesium: 1.9 mg/dL (ref 1.7–2.4)

## 2018-11-28 LAB — PHOSPHORUS: Phosphorus: 1.9 mg/dL — ABNORMAL LOW (ref 2.5–4.6)

## 2018-11-28 LAB — PROCALCITONIN: Procalcitonin: <0.1 ng/mL

## 2018-11-28 MED ORDER — KETOROLAC TROMETHAMINE 30 MG/ML IJ SOLN
30.0000 mg | Freq: Four times a day (QID) | INTRAMUSCULAR | Status: DC | PRN
  Administered 2018-11-28 – 2018-11-29 (×4): 30 mg via INTRAVENOUS
  Filled 2018-11-28 (×4): qty 1

## 2018-11-28 MED ORDER — SODIUM CHLORIDE 0.9 % IV SOLN
INTRAVENOUS | Status: DC | PRN
  Administered 2018-11-28: 50 mL via INTRAVENOUS

## 2018-11-28 MED ORDER — POTASSIUM CHLORIDE CRYS ER 20 MEQ PO TBCR
40.0000 meq | EXTENDED_RELEASE_TABLET | Freq: Once | ORAL | Status: AC
  Administered 2018-11-28: 40 meq via ORAL
  Filled 2018-11-28: qty 2

## 2018-11-28 MED ORDER — HYDROMORPHONE HCL 1 MG/ML IJ SOLN
0.5000 mg | INTRAMUSCULAR | Status: DC | PRN
  Administered 2018-11-28 – 2018-11-29 (×4): 0.5 mg via INTRAVENOUS
  Filled 2018-11-28 (×4): qty 1

## 2018-11-28 MED ORDER — KETOROLAC TROMETHAMINE 30 MG/ML IJ SOLN: 30.0000 mg | Freq: Four times a day (QID) | INTRAMUSCULAR | Status: DC

## 2018-11-28 NOTE — Progress Notes (Signed)
[place] [facility] [place] NAME: Lauren Mcmahon    MR#:  [id number]  DATE OF BIRTH:  03-Mar-1986  SUBJECTIVE:  CHIEF COMPLAINT:   Chief Complaint  Patient presents with  . Abdominal Pain   Came with abdominal pain and noted to have UTI with sepsis.  Also had some nausea and chills.  She feels slightly better today. REVIEW OF SYSTEMS:  CONSTITUTIONAL: No fever, fatigue or weakness.  EYES: No blurred or double vision.  EARS, NOSE, AND THROAT: No tinnitus or ear pain.  RESPIRATORY: No cough, shortness of breath, wheezing or hemoptysis.  CARDIOVASCULAR: No chest pain, orthopnea, edema.  GASTROINTESTINAL: Have nausea, vomiting, no diarrhea or abdominal pain.  GENITOURINARY: No dysuria, hematuria.  ENDOCRINE: No polyuria, nocturia,  HEMATOLOGY: No anemia, easy bruising or bleeding SKIN: No rash or lesion. MUSCULOSKELETAL: No joint pain or arthritis.   NEUROLOGIC: No tingling, numbness, weakness.  PSYCHIATRY: No anxiety or depression.   ROS  DRUG ALLERGIES:   Allergies  Allergen Reactions  . Penicillins Nausea Only and Rash    VITALS:  Blood pressure 106/69, pulse (!) 103, temperature 98.6 F (37 C), temperature source Oral, resp. rate 20, height 5\' 7"  (1.702 m), weight 79.6 kg, last menstrual period 11/06/2018, SpO2 100 %.  PHYSICAL EXAMINATION:  GENERAL:  33 y.o.-year-old patient lying in the bed with no acute distress.  EYES: Pupils equal, round, reactive to light and accommodation. No scleral icterus. Extraocular muscles intact.  HEENT: Head atraumatic, normocephalic. Oropharynx and nasopharynx clear.  NECK:  Supple, no jugular venous distention. No thyroid enlargement, no tenderness.  LUNGS: Normal breath sounds bilaterally, no wheezing, rales,rhonchi or crepitation. No use of accessory muscles of respiration.  CARDIOVASCULAR: S1, S2 normal. No murmurs, rubs, or gallops.  ABDOMEN: Soft, tender, nondistended. Bowel sounds present. No  organomegaly or mass.  EXTREMITIES: No pedal edema, cyanosis, or clubbing.  NEUROLOGIC: Cranial nerves II through XII are intact. Muscle strength 5/5 in all extremities. Sensation intact. Gait not checked.  PSYCHIATRIC: The patient is alert and oriented x 3.  SKIN: No obvious rash, lesion, or ulcer.   Physical Exam LABORATORY PANEL:   CBC Recent Labs  Lab 11/27/18 1926  WBC 12.9*  HGB 13.8  HCT 40.3  PLT 227   ------------------------------------------------------------------------------------------------------------------  Chemistries  Recent Labs  Lab 11/27/18 1926  NA 135  K 3.5  CL 101  CO2 23  GLUCOSE 119*  BUN 6  CREATININE 0.61  CALCIUM 8.6*  MG 1.9  AST 20  ALT 13  ALKPHOS 55  BILITOT 0.6   ------------------------------------------------------------------------------------------------------------------  Cardiac Enzymes No results for input(s): TROPONINI in the last 168 hours. ------------------------------------------------------------------------------------------------------------------  RADIOLOGY:  US Renal  Result Date: 11/28/2018 CLINICAL DATA:  Left flank pain for several days EXAM: RENAL / URINARY TRACT ULTRASOUND COMPLETE COMPARISON:  07/12/2017 FINDINGS: Right Kidney: Congenitally absent Left Kidney: Renal measurements: 15.1 x 4.9 x 7.0 cm. = volume: 270 mL. No hydronephrosis is noted. 1.4 cm cyst is noted in the upper pole similar to that noted on prior CT examination. A smaller cortical cyst is noted measuring 5 mm also stable from the prior exam. Bladder: Appears normal for degree of bladder distention. Note is made of cholelithiasis stable from the prior CT examination. IMPRESSION: Left renal cyst. Congenitally absent right kidney. Cholelithiasis without complicating factors. Electronically Signed   By: Inez Catalina M.D.   On: 11/28/2018 09:33    ASSESSMENT AND PLAN:   Active Problems:   Sepsis secondary  to UTI ([place])  39F w/ PMHx migraine  HA, congenitally absent R kidney, p/w L flank/back/abdominal pain, fever, leukocytosis, SIRS, sepsis 2/2 UTI, suspected L pyelonephritis. Hyperglycemia, hypocalcemia. - sepsis 2/2 UTI, suspected L pyelonephritis:  L flank/back/abdominal pain, fever, leukocytosis, WBC 12.9, T 38.4C, tachycardic/tachypneic, SIRS (+) in ED. U/A (+) rare bacteria, 6-10 WBC, (-) nitrite, (-) leukocyte esterase. Prior CT imaging (from 06/2017) demonstrated, "Absent right kidney with compensatory hypertrophy of the left kidney. No nephrolithiasis or evidence of obstructive uropathy. 1 cm left upper pole renal simple cyst with a smaller 4 mm hypodensity at the junction of the left upper pole and interpolar region." Cr 0.61, at baseline; low suspicion for obstructive uropathy, U/S reviewed.  Ceftriaxone. IVF. Pain ctrl.  Follow urine culture.  -Hypocalcemia: Ionized calcium. -c/w other home meds/formulary subs as tolerated/appropriate. -FEN/GI: Regular diet as tolerated. -DVT PPx: Lovenox. -Code status: Full code.   All the records are reviewed and case discussed with Care Management/Social Workerr. Management plans discussed with the patient, family and they are in agreement.  CODE STATUS: full.  TOTAL TIME TAKING CARE OF THIS PATIENT: 35 minutes.     POSSIBLE D/C IN 1-2 DAYS, DEPENDING ON CLINICAL CONDITION.   Vaughan Basta M.D on 11/28/2018   Between 7am to 6pm - Pager - [phone]  After 6pm go to [email or web address] - password [email or web address]  Office  [phone]  CC: Primary care physician; System, Pcp Not In  Note: This dictation was prepared with Dragon dictation along with smaller phrase technology. Any transcriptional errors that result from this process are unintentional.

## 2018-11-28 NOTE — Plan of Care (Signed)
  Problem: Education: Goal: Knowledge of General Education information will improve Description Including pain rating scale, medication(s)/side effects and non-pharmacologic comfort measures Outcome: Progressing   Problem: Health Behavior/Discharge Planning: Goal: Ability to manage health-related needs will improve Outcome: Progressing   Problem: Clinical Measurements: Goal: Ability to maintain clinical measurements within normal limits will improve Outcome: Progressing Goal: Will remain free from infection Outcome: Progressing Goal: Diagnostic test results will improve Outcome: Progressing Goal: Respiratory complications will improve Outcome: Progressing Goal: Cardiovascular complication will be avoided Outcome: Progressing   Problem: Activity: Goal: Risk for activity intolerance will decrease Outcome: Progressing   Problem: Nutrition: Goal: Adequate nutrition will be maintained Outcome: Progressing   Problem: Coping: Goal: Level of anxiety will decrease Outcome: Progressing   Problem: Elimination: Goal: Will not experience complications related to bowel motility Outcome: Progressing Goal: Will not experience complications related to urinary retention Outcome: Progressing   Problem: Pain Managment: Goal: General experience of comfort will improve Outcome: Progressing   Problem: Safety: Goal: Ability to remain free from injury will improve Outcome: Progressing   Problem: Urinary Elimination: Goal: Signs and symptoms of infection will decrease Outcome: Progressing

## 2018-11-29 LAB — BASIC METABOLIC PANEL
Anion gap: 8 (ref 5–15)
BUN: 9 mg/dL (ref 6–20)
CHLORIDE: 104 mmol/L (ref 98–111)
CO2: 25 mmol/L (ref 22–32)
Calcium: 8.9 mg/dL (ref 8.9–10.3)
Creatinine, Ser: 0.61 mg/dL (ref 0.44–1.00)
GFR calc Af Amer: >60 mL/min (ref >60)
GFR calc non Af Amer: >60 mL/min (ref >60)
Glucose, Bld: 110 mg/dL — ABNORMAL HIGH (ref 70–99)
Potassium: 4.4 mmol/L (ref 3.5–5.1)
Sodium: 137 mmol/L (ref 135–145)

## 2018-11-29 LAB — CBC
HEMATOCRIT: 36.8 % (ref 36.0–46.0)
Hemoglobin: 12.2 g/dL (ref 12.0–15.0)
MCH: 32.8 pg (ref 26.0–34.0)
MCHC: 33.2 g/dL (ref 30.0–36.0)
MCV: 98.9 fL (ref 80.0–100.0)
Platelets: 188 10*3/uL (ref 150–400)
RBC: 3.72 MIL/uL — ABNORMAL LOW (ref 3.87–5.11)
RDW: 12.9 % (ref 11.5–15.5)
WBC: 12.7 10*3/uL — ABNORMAL HIGH (ref 4.0–10.5)
nRBC: 0 % (ref 0.0–0.2)

## 2018-11-29 LAB — URINE CULTURE: Culture: NO GROWTH

## 2018-11-29 LAB — CALCIUM, IONIZED: Calcium, Ionized, Serum: 4.7 mg/dL (ref 4.5–5.6)

## 2018-11-29 LAB — HIV ANTIBODY (ROUTINE TESTING W REFLEX): HIV Screen 4th Generation wRfx: NONREACTIVE

## 2018-11-29 MED ORDER — ONDANSETRON HCL 4 MG PO TABS: 4.0000 mg | ORAL_TABLET | Freq: Four times a day (QID) | ORAL | 0 refills | Status: DC | PRN

## 2018-11-29 MED ORDER — OXYCODONE-ACETAMINOPHEN 5-325 MG PO TABS: 1.0000 | ORAL_TABLET | ORAL | 0 refills | Status: DC | PRN

## 2018-11-29 MED ORDER — CEPHALEXIN 500 MG PO CAPS: 500.0000 mg | ORAL_CAPSULE | Freq: Two times a day (BID) | ORAL | 0 refills | Status: AC

## 2018-11-29 MED ORDER — OXYCODONE-ACETAMINOPHEN 5-325 MG PO TABS
1.0000 | ORAL_TABLET | ORAL | Status: DC | PRN
  Administered 2018-11-29 (×2): 1 via ORAL
  Filled 2018-11-29 (×2): qty 1

## 2018-11-29 NOTE — Progress Notes (Signed)
Pnts pain still challenging overnight. Dilaudid given x's 2 with positive effect. Toradol also given PRN.

## 2018-11-29 NOTE — TOC Initial Note (Signed)
Transition of Care [facility]) - Initial/Assessment Note    Patient Details  Name: Lauren Mcmahon MRN: [id number] Date of Birth: Aug 02, 1986  Transition [facility] CM/SW Contact:    Shelbie Ammons, RN Phone Number: 11/29/2018, 3:09 PM  Clinical Narrative:    Admitted to [facility] with the diagnosis of sepsis. Lives with friend, [name] [phone]) and her two children (ages 84 & 27). No primary care physician. Prescriptions are filled at [facility] in [place].  No income. No insurance listed. States she suppose to start a new job, "Maybe after this mess is over." Takes care of all basic activities of daily living herself, can drive, but has no licenses.  No falls Friend will transport Information on [facility] given               Expected Discharge Plan: Home/Self Care Barriers to Discharge: No Barriers Identified   Patient Goals and CMS Choice Patient states their goals for this hospitalization and ongoing recovery are:: (Home) CMS Medicare.gov Compare Post Acute Care list provided to:: (NA) Choice offered to / list presented to : NA  Expected Discharge Plan and Services Expected Discharge Plan: Home/Self Care In-house Referral: Financial Counselor Discharge Planning Services: ([place]) Post Acute Care Choice: NA Living arrangements for the past 2 months: Apartment Expected Discharge Date: 11/29/18               DME Arranged: N/A DME Agency: NA HH Arranged: NA    Prior Living Arrangements/Services Living arrangements for the past 2 months: Apartment Lives with:: Significant Other, Minor Children Patient language and need for interpreter reviewed:: No Do you feel safe going back to the place where you live?: Yes      Need for Family Participation in Patient Care: No (Comment) Care giver support system in place?: No (comment) Current home services: (None) Criminal Activity/Legal Involvement  Pertinent to Current Situation/Hospitalization: No - Comment as needed  Activities of Daily Living Home Assistive Devices/Equipment: None ADL Screening (condition at time of admission) Patient's cognitive ability adequate to safely complete daily activities?: Yes Is the patient deaf or have difficulty hearing?: No Does the patient have difficulty seeing, even when wearing glasses/contacts?: No Does the patient have difficulty concentrating, remembering, or making decisions?: No Patient able to express need for assistance with ADLs?: No Does the patient have difficulty dressing or bathing?: No Independently performs ADLs?: Yes (appropriate for developmental age) Communication: Independent Dressing (OT): Independent Grooming: Independent Feeding: Independent Bathing: Independent Toileting: Independent In/Out Bed: Independent Walks in Home: Independent Does the patient have difficulty walking or climbing stairs?: No Weakness of Legs: None Weakness of Arms/Hands: None  Permission Sought/Granted Permission sought to share information with : Case Manager Permission granted to share information with : Yes, Verbal Permission Granted              Emotional Assessment Appearance:: Appears stated age Attitude/Demeanor/Rapport: (Calm Receptive ) Affect (typically observed): Accepting Orientation: : Oriented to Self, Oriented to Place, Oriented to  Time, Oriented to Situation Alcohol / Substance Use: (occasional drink) Psych Involvement: No (comment)  Admission diagnosis:  UTI (urinary tract infection) [N39.0] Pyelonephritis [N12] Left flank pain [R10.9] Patient Active Problem List   Diagnosis Date Noted  . Sepsis secondary to UTI ([place]) 11/27/2018   PCP:  System, Pcp Not In Pharmacy:   [place], [place] - [address] [place] [address] Phone: [phone] Fax: [phone]  [place] [address] [place] - [place] [address] [place] [address] Phone: [phone] Fax: [phone]  [place], [place] [address] Phone: [phone] Fax: [phone]  [place], [place] [address] Phone: [phone] Fax: [phone]  [place]), [place] - [address] [place])  [address] Phone: [phone] Fax: [phone]     Social Determinants of Health (SDOH) Interventions Lives with friend and her 2 children (ages 40 & 20)    Readmission Risk Interventions No flowsheet data found.

## 2018-11-29 NOTE — Progress Notes (Signed)
Pt for discharge home. A/o, no distress.  Instructions to pt. meds diet / activity and f/u.  Verbalize understanding.

## 2018-11-29 NOTE — Discharge Summary (Signed)
[place] [facility] [place] NAME: Lauren Mcmahon    MR#:  [id number]  DATE OF BIRTH:  1985/11/26  DATE OF ADMISSION:  11/27/2018 ADMITTING PHYSICIAN: Arta Silence, MD  DATE OF DISCHARGE: 11/29/2018   PRIMARY CARE PHYSICIAN: System, Pcp Not In    ADMISSION DIAGNOSIS:  UTI (urinary tract infection) [N39.0] Pyelonephritis [N12] Left flank pain [R10.9]  DISCHARGE DIAGNOSIS:  Active Problems:   Sepsis secondary to UTI ([place])   SECONDARY DIAGNOSIS:   Past Medical History:  Diagnosis Date  . Arthritis    deg disk disease - lower back  . Depression    hx - no meds  . Fibroid   . Heartburn in pregnancy    tx with tums  . PONV (postoperative nausea and vomiting)     HOSPITAL COURSE:   38 F w/ PMHx migraine HA, congenitally absent R kidney, p/w L flank/back/abdominal pain, fever, leukocytosis, SIRS, sepsis 2/2 UTI, suspected L pyelonephritis. Hyperglycemia, hypocalcemia. - sepsis 2/2 UTI, suspected L pyelonephritis:  L flank/back/abdominal pain, fever, leukocytosis, WBC 12.9, T 38.4C, tachycardic/tachypneic, SIRS (+) in ED. U/A (+) rare bacteria, 6-10 WBC, (-) nitrite, (-) leukocyte esterase. Prior CT imaging (from 06/2017) demonstrated, "Absent right kidney with compensatory hypertrophy of the left kidney. No nephrolithiasis or evidence of obstructive uropathy. 1 cm left upper pole renal simple cyst with a smaller 4 mm hypodensity at the junction of the left upper pole and interpolar region." Cr 0.61, at baseline; low suspicion for obstructive uropathy, U/S reviewed.  Ceftriaxone. IVF. Pain ctrl.  Follow urine culture- no growth. Pt felt much better next day- change to oral keflex.  -Hypocalcemia: Ionized calcium. -c/w other home meds/formulary subs as tolerated/appropriate. -FEN/GI: Regular diet as tolerated. -DVT PPx: Lovenox.  DISCHARGE CONDITIONS:   Stable.  CONSULTS OBTAINED:  Treatment Team:  Arta Silence,  MD  DRUG ALLERGIES:   Allergies  Allergen Reactions  . Penicillins Nausea Only and Rash    DISCHARGE MEDICATIONS:   Allergies as of 11/29/2018      Reactions   Penicillins Nausea Only, Rash      Medication List    STOP taking these medications   doxycycline 100 MG capsule Commonly known as:  VIBRAMYCIN   metroNIDAZOLE 500 MG tablet Commonly known as:  FLAGYL     TAKE these medications   cephALEXin 500 MG capsule Commonly known as:  KEFLEX Take 1 capsule (500 mg total) by mouth 2 (two) times daily for 5 days.   ondansetron 4 MG tablet Commonly known as:  ZOFRAN Take 1 tablet (4 mg total) by mouth every 6 (six) hours as needed for nausea. What changed:    when to take this  reasons to take this   oxyCODONE-acetaminophen 5-325 MG tablet Commonly known as:  PERCOCET/ROXICET Take 1 tablet by mouth every 4 (four) hours as needed for moderate pain. What changed:    how much to take  reasons to take this        DISCHARGE INSTRUCTIONS:    Follow with PMD in 1 week.  If you experience worsening of your admission symptoms, develop shortness of breath, life threatening emergency, suicidal or homicidal thoughts you must seek medical attention immediately by calling 911 or calling your MD immediately  if symptoms less severe.  You Must read complete instructions/literature along with all the possible adverse reactions/side effects for all the Medicines you take and that have been prescribed to you. Take any new Medicines after you have completely  understood and accept all the possible adverse reactions/side effects.   Please note  You were cared for by a hospitalist during your hospital stay. If you have any questions about your discharge medications or the care you received while you were in the hospital after you are discharged, you can call the unit and asked to speak with the hospitalist on call if the hospitalist that took care of you is not available. Once you  are discharged, your primary care physician will handle any further medical issues. Please note that NO REFILLS for any discharge medications will be authorized once you are discharged, as it is imperative that you return to your primary care physician (or establish a relationship with a primary care physician if you do not have one) for your aftercare needs so that they can reassess your need for medications and monitor your lab values.    Today   CHIEF COMPLAINT:   Chief Complaint  Patient presents with  . Abdominal Pain    HISTORY OF PRESENT ILLNESS:  Lauren Mcmahon  is a 33 y.o. female with a known history of migraine HA, congenitally absent R kidney, p/w L flank/back/abdominal pain, fever, leukocytosis, SIRS, sepsis 2/2 UTI, suspected L pyelonephritis. Endorses 3-4d Hx pain throughout "my whole left side" (L abdomen, L flank, L low/mid back). Endorses fever, chills and diaphoresis. (-) rigors. Endorses increased urinary frequency and malodorous urine, but denies pain, dysuria, burning, urgency, hesitancy, dribbling, nocturia, incontinence, gross hematuria/pyuria. Endorses nausea throughout the day (Tuesday 11/27/2018), vomiting x1 en route to ED. Endorses frontal headache w/o photophobia/phonophobia. Denies diarrhea. States symptoms have been progressive, but were severe since Monday (11/26/2018) evening, and reportedly steadily worsening throughout the day Tuesday (11/27/2018). WBC 12.9, T 38.4C, tachycardic/tachypneic, SIRS (+) in ED. U/A (+) rare bacteria, 6-10 WBC, (-) nitrite, (-) leukocyte esterase. Cr 0.61, at baseline; low suspicion for obstructive uropathy (pt congenitally absent R kidney; L-sided obstruction expected to precipitate renal failure). Pt appears well, endorses improvement since coming to ED.   VITAL SIGNS:  Blood pressure (!) 107/53, pulse 85, temperature 98.1 F (36.7 C), resp. rate 18, height 5\' 7"  (1.702 m), weight 79.6 kg, last menstrual period 11/06/2018, SpO2  100 %.  I/O:    Intake/Output Summary (Last 24 hours) at 11/29/2018 1713 Last data filed at 11/29/2018 1609 Gross per 24 hour  Intake 353.89 ml  Output -  Net 353.89 ml    PHYSICAL EXAMINATION:  GENERAL:  33 y.o.-year-old patient lying in the bed with no acute distress.  EYES: Pupils equal, round, reactive to light and accommodation. No scleral icterus. Extraocular muscles intact.  HEENT: Head atraumatic, normocephalic. Oropharynx and nasopharynx clear.  NECK:  Supple, no jugular venous distention. No thyroid enlargement, no tenderness.  LUNGS: Normal breath sounds bilaterally, no wheezing, rales,rhonchi or crepitation. No use of accessory muscles of respiration.  CARDIOVASCULAR: S1, S2 normal. No murmurs, rubs, or gallops.  ABDOMEN: Soft, non-tender, non-distended. Bowel sounds present. No organomegaly or mass.  EXTREMITIES: No pedal edema, cyanosis, or clubbing.  NEUROLOGIC: Cranial nerves II through XII are intact. Muscle strength 5/5 in all extremities. Sensation intact. Gait not checked.  PSYCHIATRIC: The patient is alert and oriented x 3.  SKIN: No obvious rash, lesion, or ulcer.   DATA REVIEW:   CBC Recent Labs  Lab 11/29/18 0346  WBC 12.7*  HGB 12.2  HCT 36.8  PLT 188    Chemistries  Recent Labs  Lab 11/27/18 1926 11/29/18 0346  NA 135 137  K 3.5  4.4  CL 101 104  CO2 23 25  GLUCOSE 119* 110*  BUN 6 9  CREATININE 0.61 0.61  CALCIUM 8.6* 8.9  MG 1.9  --   AST 20  --   ALT 13  --   ALKPHOS 55  --   BILITOT 0.6  --     Cardiac Enzymes No results for input(s): TROPONINI in the last 168 hours.  Microbiology Results  Results for orders placed or performed during the hospital encounter of 11/27/18  Urine Culture     Status: None   Collection Time: 11/28/18  3:38 PM  Result Value Ref Range Status   Specimen Description   Final    URINE, RANDOM Performed at [facility], [address], [place], [place] [address]    Special Requests   Final     NONE Performed at [facility], [address], [place], [place] [address]    Culture   Final    NO GROWTH Performed at [facility], [place] [address], [place], [place] [address]    Report Status 11/29/2018 FINAL  Final    RADIOLOGY:  US Renal  Result Date: 11/28/2018 CLINICAL DATA:  Left flank pain for several days EXAM: RENAL / URINARY TRACT ULTRASOUND COMPLETE COMPARISON:  07/12/2017 FINDINGS: Right Kidney: Congenitally absent Left Kidney: Renal measurements: 15.1 x 4.9 x 7.0 cm. = volume: 270 mL. No hydronephrosis is noted. 1.4 cm cyst is noted in the upper pole similar to that noted on prior CT examination. A smaller cortical cyst is noted measuring 5 mm also stable from the prior exam. Bladder: Appears normal for degree of bladder distention. Note is made of cholelithiasis stable from the prior CT examination. IMPRESSION: Left renal cyst. Congenitally absent right kidney. Cholelithiasis without complicating factors. Electronically Signed   By: Inez Catalina M.D.   On: 11/28/2018 09:33    EKG:  No orders found for this or any previous visit.    Management plans discussed with the patient, family and they are in agreement.  CODE STATUS:     Code Status Orders  (From admission, onward)         Start     Ordered   11/27/18 2312  Full code  Continuous     11/27/18 2311        Code Status History    Date Active Date Inactive Code Status Order ID Comments User Context   06/11/2012 1052 06/13/2012 1543 Full Code [id number]  Kyra Manges, RN Inpatient      TOTAL TIME TAKING CARE OF THIS PATIENT: 35 minutes.    Vaughan Basta M.D on 11/29/2018 at 5:13 PM  Between 7am to 6pm - Pager - [phone]  After 6pm go to [email or web address] - password [email or web address] [facility]  Office  [phone]  CC: Primary care physician; System, Pcp Not In   Note: This dictation was prepared with Dragon dictation along with smaller phrase  technology. Any transcriptional errors that result from this process are unintentional.

## 2019-07-14 IMAGING — CT CT ABD-PELV W/ CM
2 of 4 series · 15 of 46 positions shown, 17 images · IV contrast (APPLIED)
Comparison: Pelvic ultrasound 02/11/2014

CLINICAL DATA: Left flank pain with nausea x3 days.

EXAM:
CT ABDOMEN AND PELVIS WITH CONTRAST
TECHNIQUE: Multidetector CT imaging of the abdomen and pelvis was performed
using the standard protocol following bolus administration of
intravenous contrast.
CONTRAST:  100 cc ATW9K5-4NN IOPAMIDOL (ATW9K5-4NN) INJECTION 61%

[Series 3: abd/ pelvis 5.0 i30f 2 · axial · 0.71mm/px · z∈[+777,+1192]mm · 12 of 93 slices shown, 14 images]
[im 5/93  soft-tissue]
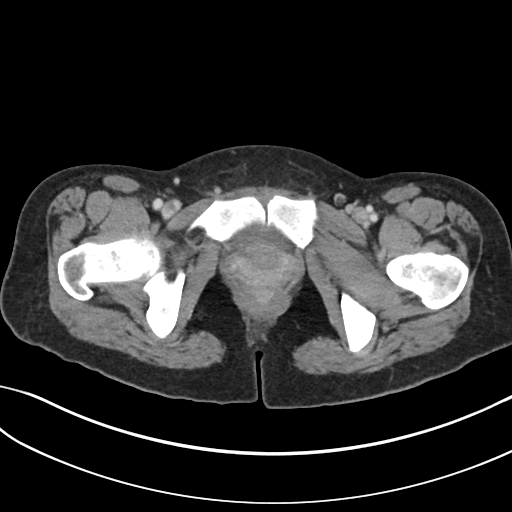
[im 5/93  bone]
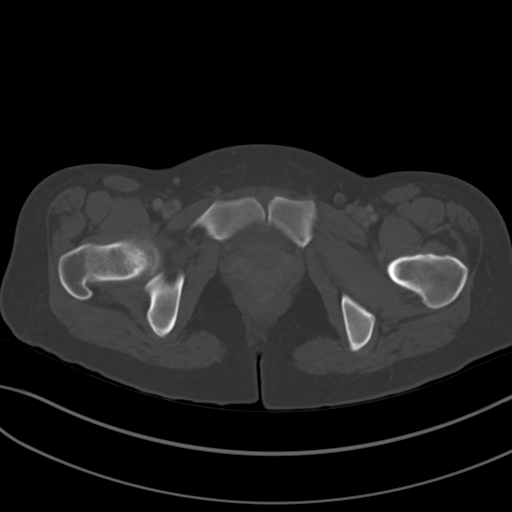
[im 14/93  soft-tissue]
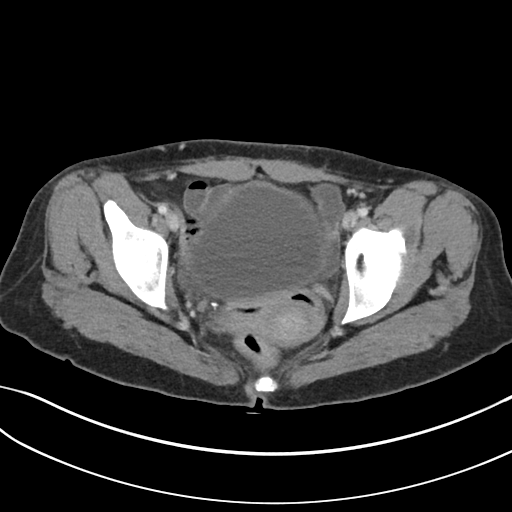
[im 22/93  soft-tissue]
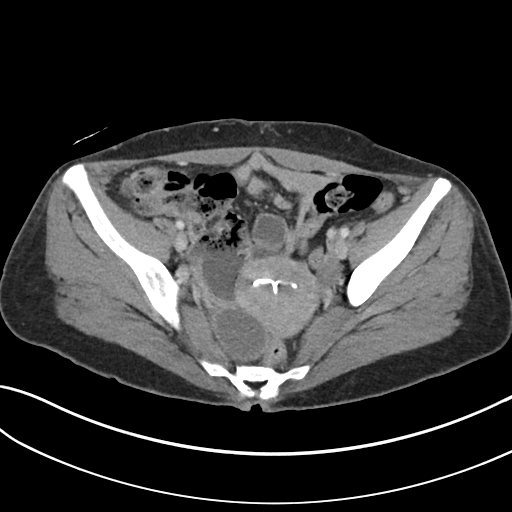
[im 27/93  soft-tissue]
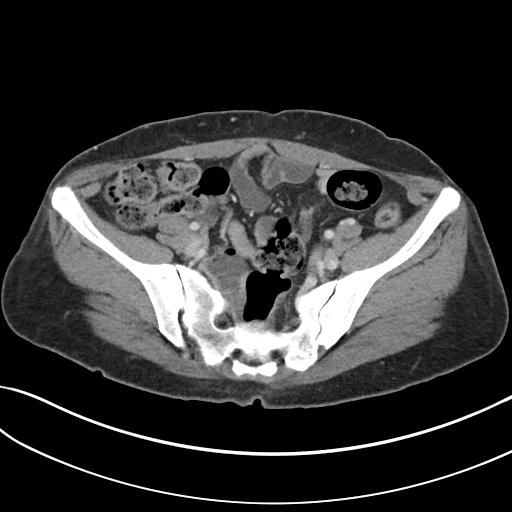
[im 36/93  soft-tissue]
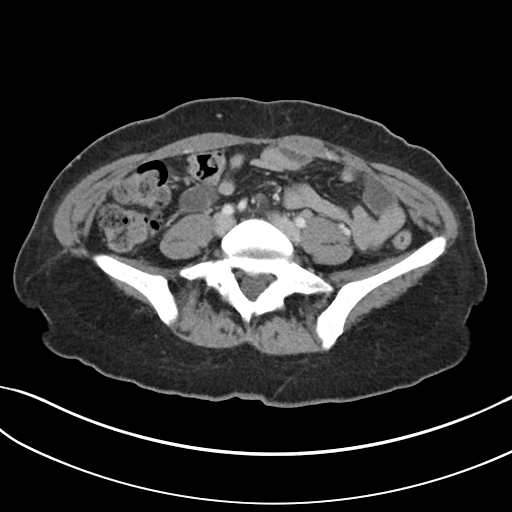
[im 44/93  soft-tissue]
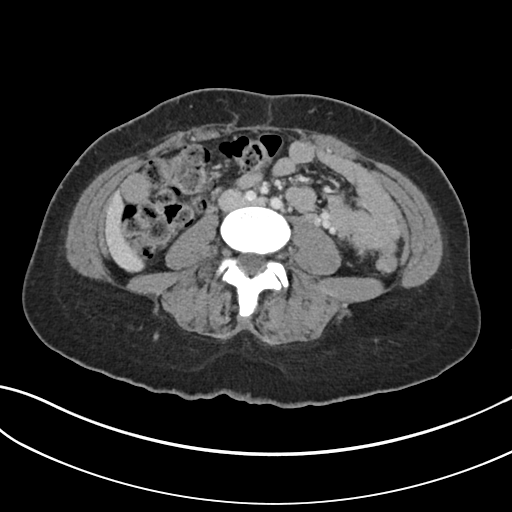
[im 49/93  soft-tissue]
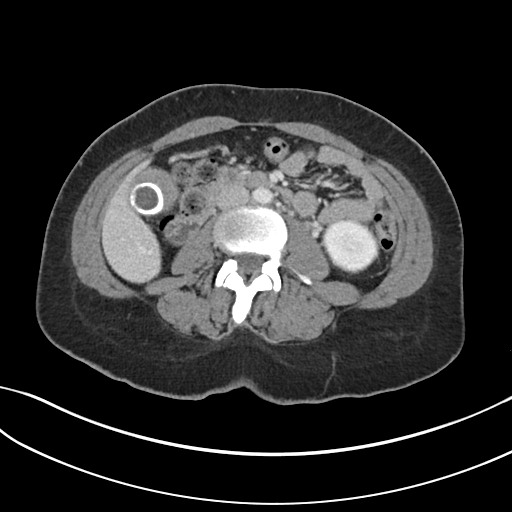
[im 57/93  soft-tissue]
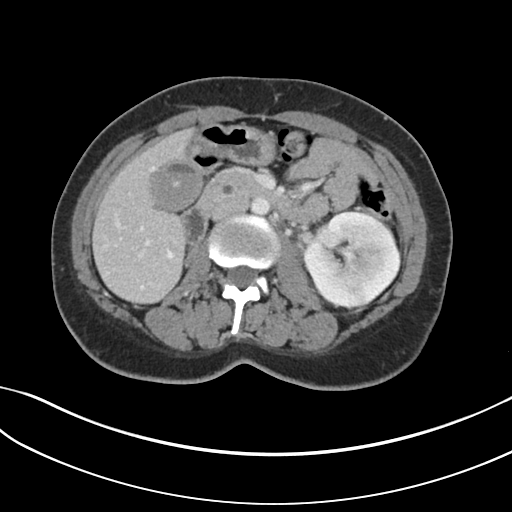
[im 66/93  soft-tissue]
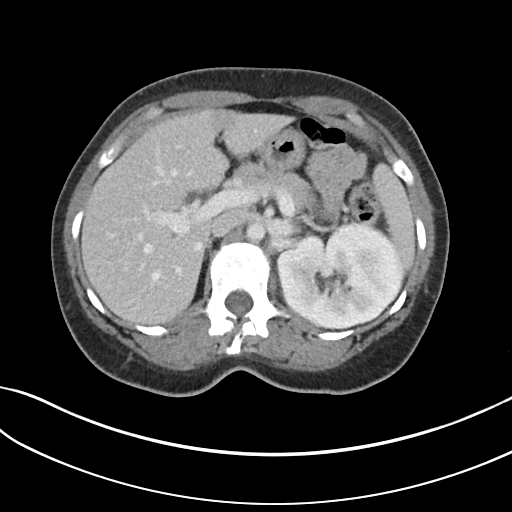
[im 66/93  bone]
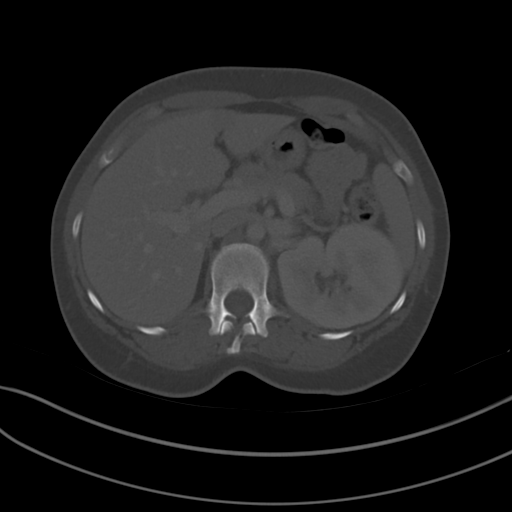
[im 71/93  soft-tissue]
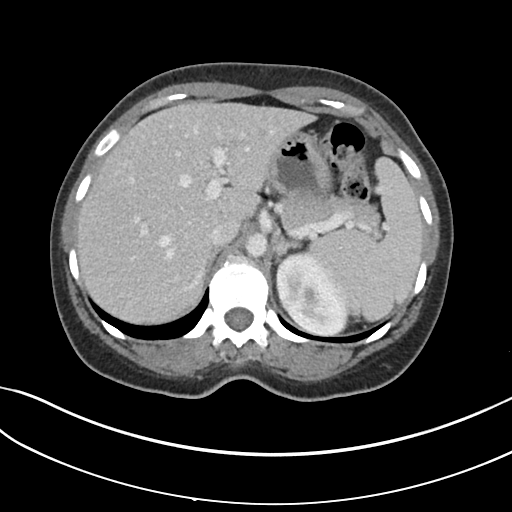
[im 79/93  soft-tissue]
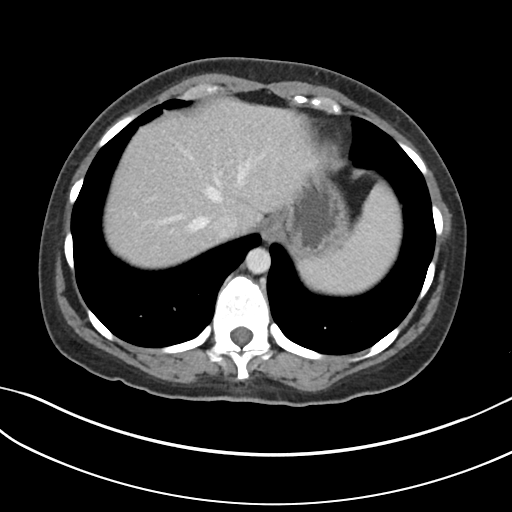
[im 88/93  soft-tissue]
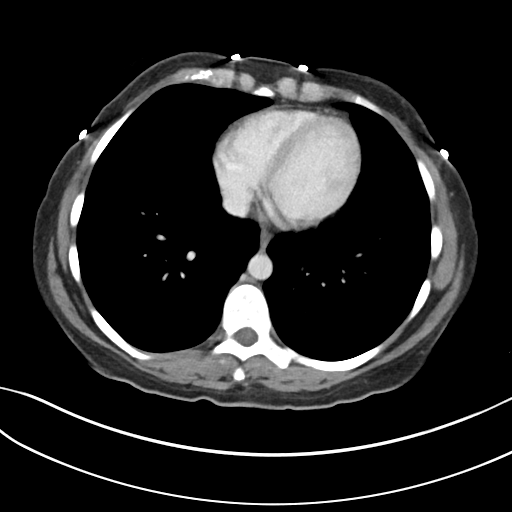

[Series 6: coronal soft tissue · coronal · 0.67mm/px · 3 of 83 slices shown]
[im 28/83  soft-tissue]
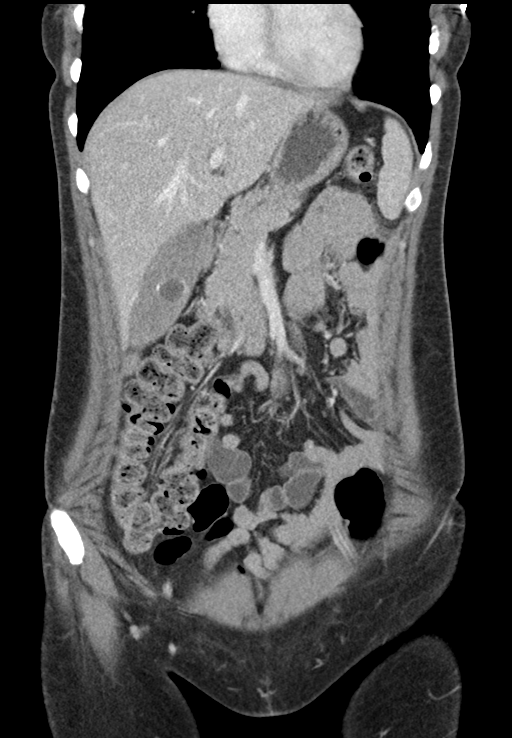
[im 37/83  soft-tissue]
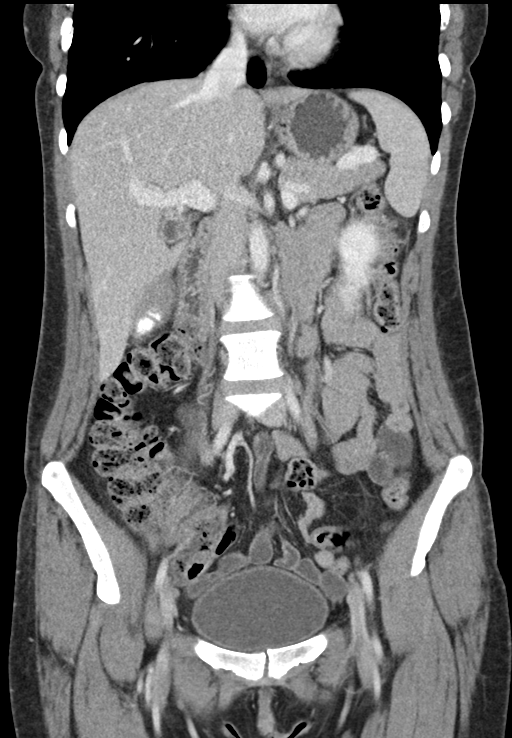
[im 46/83  soft-tissue]
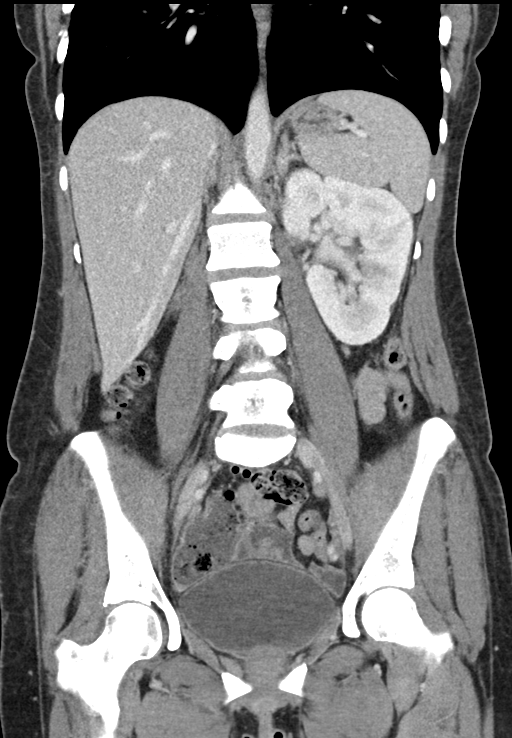

[15 of 46 positions shown; findings below may reference images not displayed]

FINDINGS: Lower chest: No acute abnormality.

Hepatobiliary: Cholelithiasis is noted with numerous gallstones
measuring up to 2.1 cm. No wall thickening or pericholecystic fluid.
No biliary dilatation. Tiny 5 mm right hepatic lobe hypodensity, too
small to further characterize but statistically more likely to
represent a cyst or hemangioma.

Pancreas: Normal

Spleen: Normal

Adrenals/Urinary Tract: Normal bilateral adrenal glands. Absent
right kidney with compensatory hypertrophy of the left kidney. 1 cm
cyst in the left upper pole with smaller 4 mm hypodensity in the mid
to upper pole too small to further characterize post systemic
consistent with a cyst. No nephrolithiasis or obstructive uropathy.

Stomach/Bowel: No bowel obstruction or inflammation. Moderate fecal
residue throughout the colon. Normal appendix. Small nondistended
stomach.

Vascular/Lymphatic: No significant vascular findings are present. No
enlarged abdominal or pelvic lymph nodes.

Reproductive: There is a 4.5 x 3.7 x 4.1 cm right ovarian cyst. No
mural nodularity or septation. Intrauterine device is noted of the
uterus. Normal left ovary.

Other: No abdominal wall hernia or abnormality. No abdominopelvic
ascites.

Musculoskeletal: No acute osseous abnormality. Mild disc space
narrowing L5-S1 with broad-based disc bulging.
IMPRESSION: 1. Absent right kidney with compensatory hypertrophy of the left
kidney. No nephrolithiasis or evidence of obstructive uropathy.
2. 1 cm left upper pole renal simple cyst with a smaller 4 mm
hypodensity at the junction of the left upper pole and interpolar
region.
3. Cholelithiasis without secondary signs of acute cholecystitis.
4. 5 mm nonspecific hypodensity in the right hepatic lobe too small
to further characterize but statistically more likely to represent a
cyst or hemangioma.

## 2020-06-01 IMAGING — US RENAL/URINARY TRACT ULTRASOUND
1 series · 14 of 25 positions shown · non-contrast
Comparison: 07/12/2017

CLINICAL DATA: Left flank pain for several days

EXAM:
RENAL / URINARY TRACT ULTRASOUND COMPLETE

[Series 1: renal/urinary tract ultrasound · 0.23mm/px · 14 of 32 slices shown]
[im 1/32]
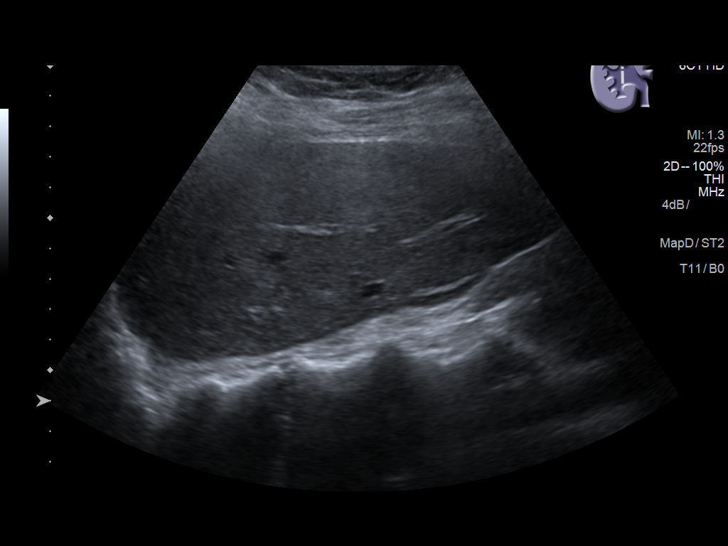
[im 3/32]
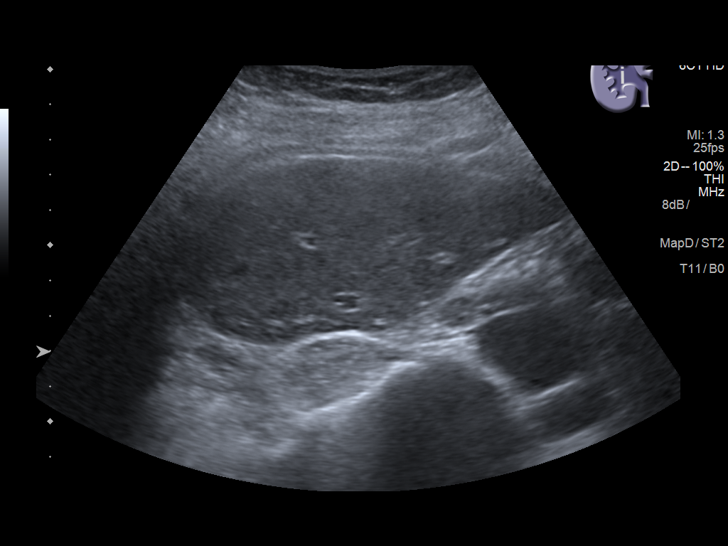
[im 6/32]
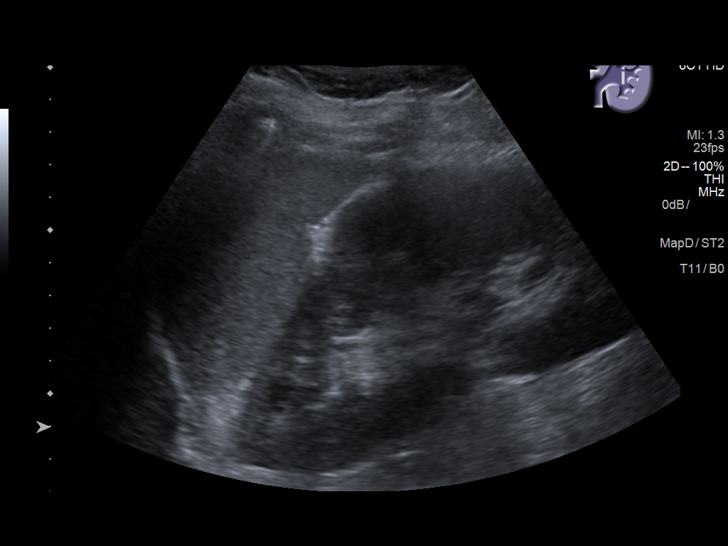
[im 8/32]
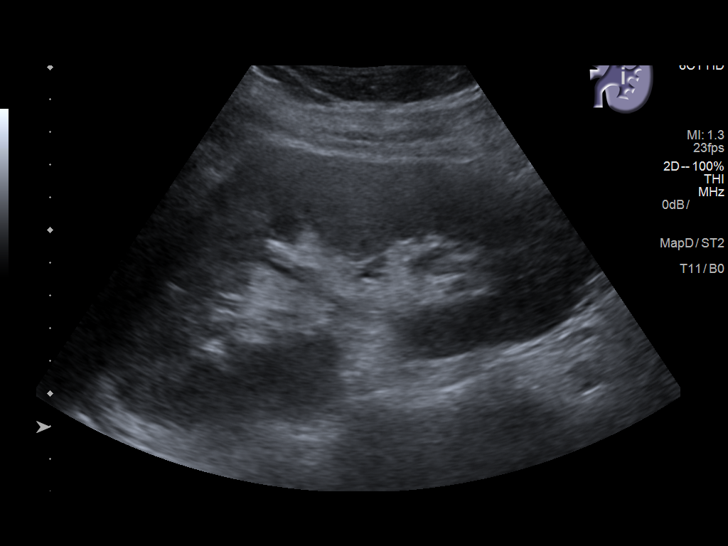
[im 11/32]
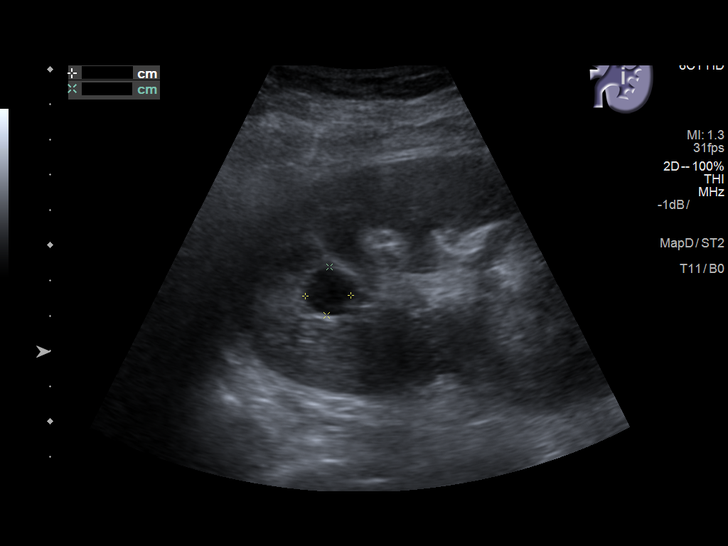
[im 12/32]
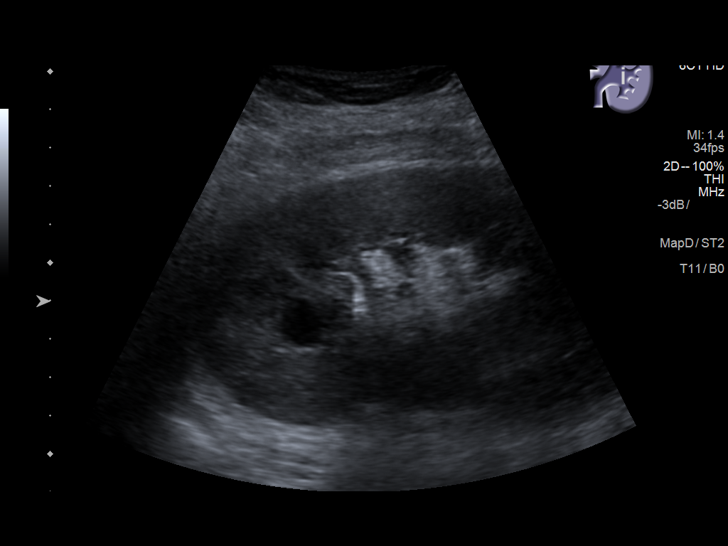
[im 15/32]
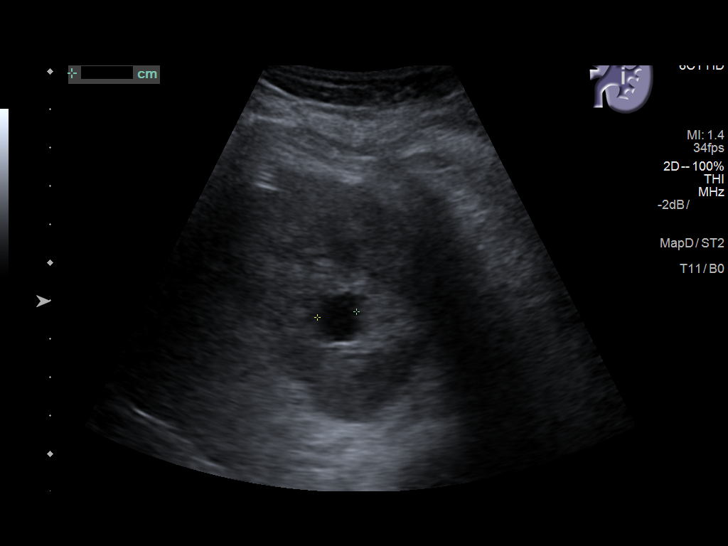
[im 17/32]
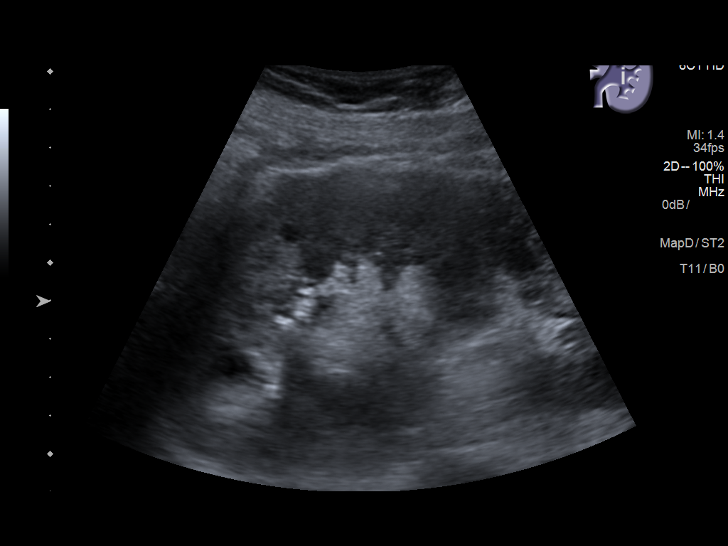
[im 20/32]
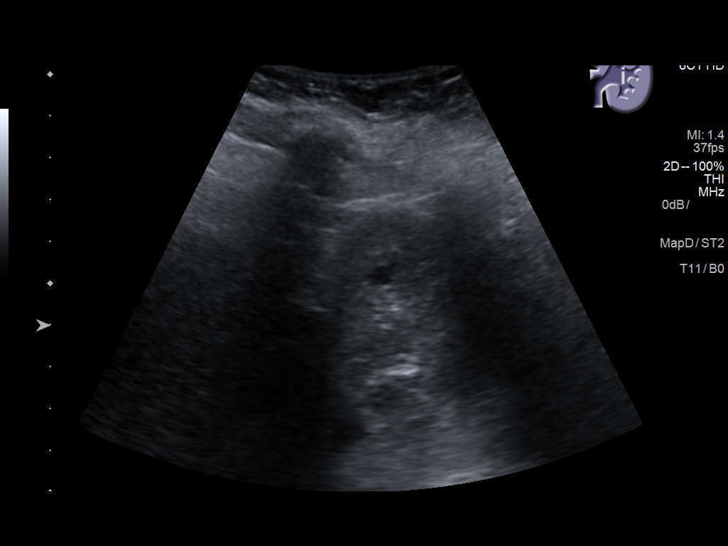
[im 21/32]
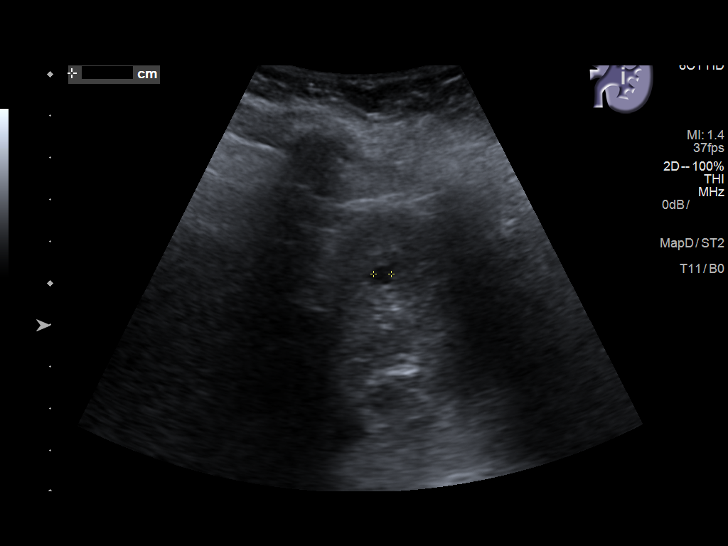
[im 24/32]
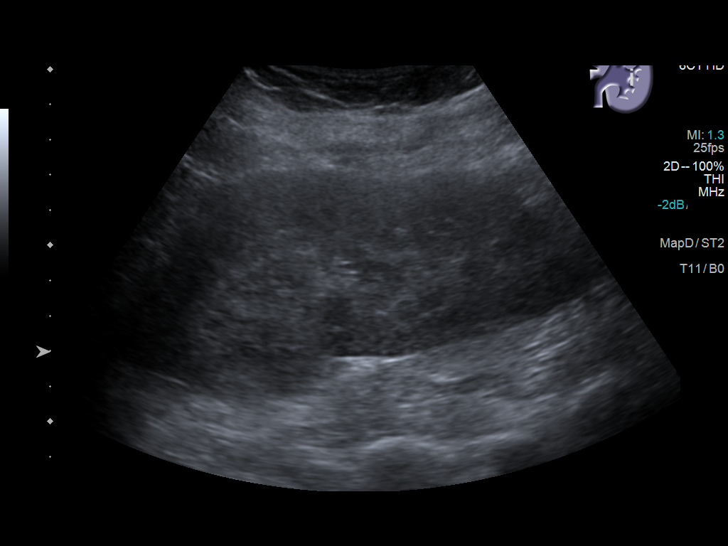
[im 26/32]
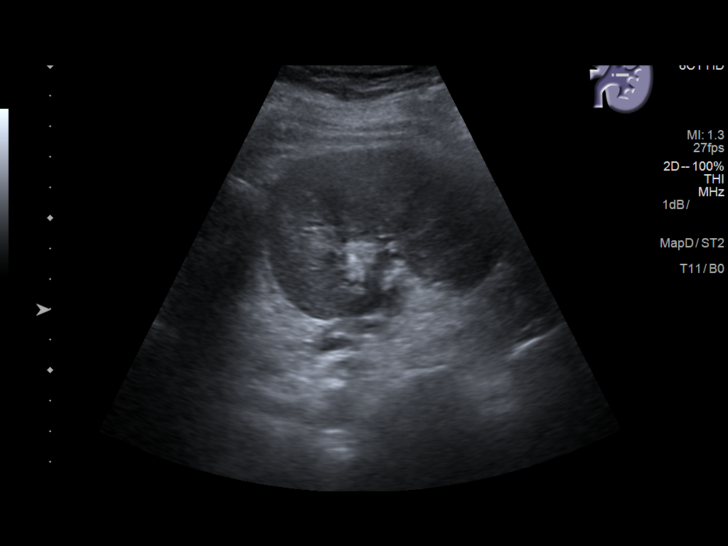
[im 29/32]
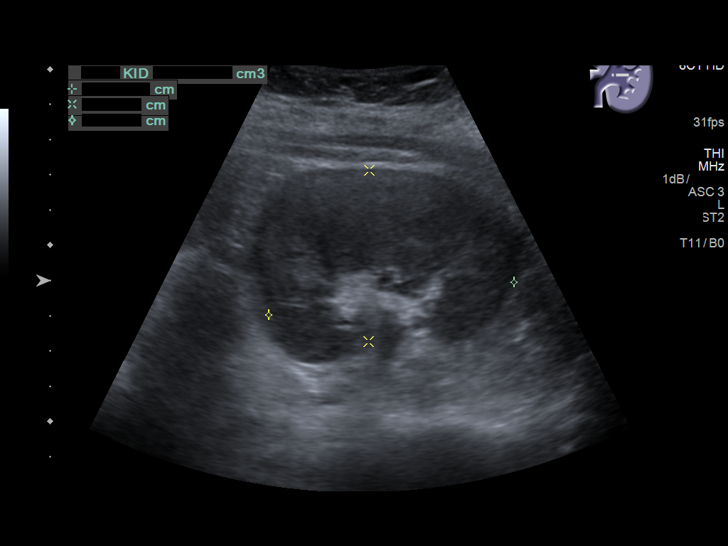
[im 32/32]
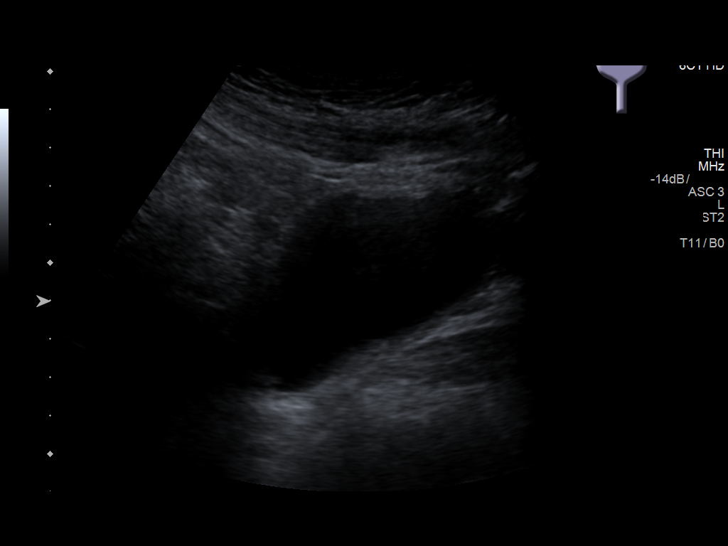

[14 of 25 positions shown; findings below may reference images not displayed]

FINDINGS: Right Kidney:

Congenitally absent

Left Kidney:

Renal measurements: 15.1 x 4.9 x 7.0 cm. = volume: 270 mL. No
hydronephrosis is noted. 1.4 cm cyst is noted in the upper pole
similar to that noted on prior CT examination. A smaller cortical
cyst is noted measuring 5 mm also stable from the prior exam.

Bladder:

Appears normal for degree of bladder distention.

Note is made of cholelithiasis stable from the prior CT examination.
IMPRESSION: Left renal cyst.

Congenitally absent right kidney.

Cholelithiasis without complicating factors.

## 2021-05-30 ENCOUNTER — Emergency Department (HOSPITAL_BASED_OUTPATIENT_CLINIC_OR_DEPARTMENT_OTHER)
Admission: EM | Admit: 2021-05-30 | Discharge: 2021-05-30 | Disposition: A | Discharge status: Home / Self Care | Payer: Self-pay | Attending: Emergency Medicine | Admitting: Emergency Medicine

## 2021-05-30 ENCOUNTER — Other Ambulatory Visit: Payer: Self-pay

## 2021-05-30 ENCOUNTER — Encounter (HOSPITAL_BASED_OUTPATIENT_CLINIC_OR_DEPARTMENT_OTHER): Payer: Self-pay | Admitting: *Deleted

## 2021-05-30 ENCOUNTER — Emergency Department (HOSPITAL_BASED_OUTPATIENT_CLINIC_OR_DEPARTMENT_OTHER): Payer: Self-pay

## 2021-05-30 DIAGNOSIS — R109 Unspecified abdominal pain: Secondary | ICD-10-CM | POA: Insufficient documentation

## 2021-05-30 DIAGNOSIS — F17210 Nicotine dependence, cigarettes, uncomplicated: Secondary | ICD-10-CM | POA: Insufficient documentation

## 2021-05-30 DIAGNOSIS — M545 Low back pain, unspecified: Secondary | ICD-10-CM | POA: Insufficient documentation

## 2021-05-30 LAB — CBC WITH DIFFERENTIAL/PLATELET
Abs Immature Granulocytes: 0.02 10*3/uL (ref 0.00–0.07)
Basophils Absolute: 0 10*3/uL (ref 0.0–0.1)
Basophils Relative: 0 %
Eosinophils Absolute: 0.2 10*3/uL (ref 0.0–0.5)
Eosinophils Relative: 2 %
HCT: 44.1 % (ref 36.0–46.0)
Hemoglobin: 15.7 g/dL — ABNORMAL HIGH (ref 12.0–15.0)
Immature Granulocytes: 0 %
Lymphocytes Relative: 30 %
Lymphs Abs: 2.4 10*3/uL (ref 0.7–4.0)
MCH: 33.9 pg (ref 26.0–34.0)
MCHC: 35.6 g/dL (ref 30.0–36.0)
MCV: 95.2 fL (ref 80.0–100.0)
Monocytes Absolute: 0.6 10*3/uL (ref 0.1–1.0)
Monocytes Relative: 7 %
Neutro Abs: 4.8 10*3/uL (ref 1.7–7.7)
Neutrophils Relative %: 61 %
Platelets: 236 10*3/uL (ref 150–400)
RBC: 4.63 MIL/uL (ref 3.87–5.11)
RDW: 12.7 % (ref 11.5–15.5)
WBC: 8 10*3/uL (ref 4.0–10.5)
nRBC: 0 % (ref 0.0–0.2)

## 2021-05-30 LAB — URINALYSIS, ROUTINE W REFLEX MICROSCOPIC
Bilirubin Urine: NEGATIVE
Glucose, UA: NEGATIVE mg/dL
Hgb urine dipstick: NEGATIVE
Ketones, ur: 40 mg/dL — AB
Leukocytes,Ua: NEGATIVE
Nitrite: NEGATIVE
Protein, ur: NEGATIVE mg/dL
Specific Gravity, Urine: 1.015 (ref 1.005–1.030)
pH: 6.5 (ref 5.0–8.0)

## 2021-05-30 LAB — COMPREHENSIVE METABOLIC PANEL
ALT: 27 U/L (ref 0–44)
AST: 24 U/L (ref 15–41)
Albumin: 4.3 g/dL (ref 3.5–5.0)
Alkaline Phosphatase: 73 U/L (ref 38–126)
Anion gap: 7 (ref 5–15)
BUN: 12 mg/dL (ref 6–20)
CO2: 27 mmol/L (ref 22–32)
Calcium: 9.4 mg/dL (ref 8.9–10.3)
Chloride: 102 mmol/L (ref 98–111)
Creatinine, Ser: 0.85 mg/dL (ref 0.44–1.00)
GFR, Estimated: >60 mL/min (ref >60)
Glucose, Bld: 83 mg/dL (ref 70–99)
Potassium: 3.5 mmol/L (ref 3.5–5.1)
Sodium: 136 mmol/L (ref 135–145)
Total Bilirubin: 0.5 mg/dL (ref 0.3–1.2)
Total Protein: 7.6 g/dL (ref 6.5–8.1)

## 2021-05-30 LAB — PREGNANCY, URINE: Preg Test, Ur: NEGATIVE

## 2021-05-30 MED ORDER — KETOROLAC TROMETHAMINE 30 MG/ML IJ SOLN
30.0000 mg | Freq: Once | INTRAMUSCULAR | Status: AC
  Administered 2021-05-30: 30 mg via INTRAVENOUS
  Filled 2021-05-30: qty 1

## 2021-05-30 NOTE — ED Notes (Signed)
Patient left ED with ABCs intact, alert and oriented x4, respirations even and unlabored. Discharge instructions reviewed and all questions answered.   

## 2021-05-30 NOTE — ED Provider Notes (Signed)
[facility] Provider Note   CSN: [id number] Arrival date & time: 05/30/21  2029     History Chief Complaint  Patient presents with   Back Pain    Lauren Mcmahon is a 35 y.o. female.  History of 1 kidney. Patient complains of left flank pain that started Friday.  Pain has been intermittent.  Pain worsens with movement.  She rates the pain 7 out of 10.  She recently had this pain in the past when she had pyelonephritis.  She denies any urinary symptoms.  She denies she does states she has darker urination than normal.  Does have associated nausea with no vomiting. she denies any fevers, chills, chest pain, shortness of breath, abdominal pain.   Back Pain Associated symptoms: no abdominal pain, no chest pain, no fever, no headaches, no numbness, no pelvic pain and no weakness       Past Medical History:  Diagnosis Date   Arthritis    deg disk disease - lower back   Depression    hx - no meds   Fibroid    Heartburn in pregnancy    tx with tums   PONV (postoperative nausea and vomiting)     Patient Active Problem List   Diagnosis Date Noted   Sepsis secondary to UTI ([place]) 11/27/2018    Past Surgical History:  Procedure Laterality Date   CESAREAN SECTION  2009   x 1   CESAREAN SECTION  06/11/2012   Procedure: CESAREAN SECTION;  Surgeon: Logan Bores, MD;  Location: [facility];  Service: Obstetrics;  Laterality: N/A;  Repeat cesarean section with delivery of baby girl at 52. Apgars 8/9.   WISDOM TOOTH EXTRACTION       OB History     Gravida  2   Para  2   Term  2   Preterm  0   AB  0   Living  2      SAB  0   IAB  0   Ectopic  0   Multiple  0   Live Births  2           Family History  Problem Relation Age of Onset   Hypertension Mother    Cancer Maternal Grandmother    Heart disease Maternal Grandfather    Diabetes Paternal Grandfather    Hypertension Paternal Grandfather    Anesthesia problems Neg Hx      Social History   Tobacco Use   Smoking status: Every Day    Packs/day: 0.50    Years: 10.00    Pack years: 5.00    Types: Cigarettes   Smokeless tobacco: Never  Vaping Use   Vaping Use: Never used  Substance Use Topics   Alcohol use: Yes    Comment: rarely   Drug use: Yes    Types: Marijuana    Comment: 3-4 times a week    Home Medications Prior to Admission medications   Medication Sig Start Date End Date Taking? Authorizing Provider  ondansetron (ZOFRAN) 4 MG tablet Take 1 tablet (4 mg total) by mouth every 6 (six) hours as needed for nausea. 11/29/18   Vaughan Basta, MD  oxyCODONE-acetaminophen (PERCOCET/ROXICET) 5-325 MG tablet Take 1 tablet by mouth every 4 (four) hours as needed for moderate pain. 11/29/18   Vaughan Basta, MD    Allergies    Penicillins  Review of Systems   Review of Systems  Constitutional:  Negative for chills and fever.  HENT:  Negative for congestion and rhinorrhea.   Eyes:  Negative for visual disturbance.  Respiratory:  Negative for cough, chest tightness and shortness of breath.   Cardiovascular:  Negative for chest pain, palpitations and leg swelling.  Gastrointestinal:  Negative for abdominal pain, constipation, diarrhea, nausea and vomiting.  Genitourinary:  Positive for flank pain. Negative for difficulty urinating, hematuria and pelvic pain.  Musculoskeletal:  Positive for back pain.  Skin:  Negative for rash and wound.  Neurological:  Negative for dizziness, syncope, weakness, light-headedness, numbness and headaches.  All other systems reviewed and are negative.  Physical Exam Updated Vital Signs BP 119/90 (BP Location: Right Arm)   Pulse 86   Temp 98.1 F (36.7 C) (Oral)   Resp 16   Ht '5\' 7"'$  (1.702 m)   Wt 79.4 kg   LMP 05/15/2021 (Approximate)   SpO2 100%   BMI 27.41 kg/m   Physical Exam Vitals and nursing note reviewed.  Constitutional:      General: She is not in acute distress.    Appearance:  Normal appearance. She is not ill-appearing, toxic-appearing or diaphoretic.  HENT:     Head: Normocephalic and atraumatic.  Eyes:     General: No scleral icterus.       Right eye: No discharge.        Left eye: No discharge.     Conjunctiva/sclera: Conjunctivae normal.  Cardiovascular:     Rate and Rhythm: Normal rate and regular rhythm.     Pulses: Normal pulses.     Heart sounds: Normal heart sounds, S1 normal and S2 normal. No murmur heard.   No friction rub. No gallop.  Pulmonary:     Effort: Pulmonary effort is normal. No respiratory distress.     Breath sounds: Normal breath sounds. No wheezing, rhonchi or rales.  Abdominal:     General: Abdomen is flat. Bowel sounds are normal. There is no distension.     Palpations: Abdomen is soft. There is no pulsatile mass.     Tenderness: There is no abdominal tenderness. There is left CVA tenderness. There is no right CVA tenderness, guarding or rebound.  Musculoskeletal:     Right lower leg: No edema.     Left lower leg: No edema.  Skin:    General: Skin is warm and dry.     Coloration: Skin is not jaundiced.     Findings: No bruising, erythema, lesion or rash.  Neurological:     General: No focal deficit present.     Mental Status: She is alert and oriented to person, place, and time.  Psychiatric:        Mood and Affect: Mood normal.        Behavior: Behavior normal.    ED Results / Procedures / Treatments   Labs (all labs ordered are listed, but only abnormal results are displayed) Labs Reviewed  URINALYSIS, ROUTINE W REFLEX MICROSCOPIC - Abnormal; Notable for the following components:      Result Value   Ketones, ur 40 (*)    All other components within normal limits  CBC WITH DIFFERENTIAL/PLATELET - Abnormal; Notable for the following components:   Hemoglobin 15.7 (*)    All other components within normal limits  PREGNANCY, URINE  COMPREHENSIVE METABOLIC PANEL    EKG None  Radiology CT Renal Stone  Study  Result Date: 05/30/2021 CLINICAL DATA:  Low back pain radiating to the low abdomen EXAM: CT ABDOMEN AND PELVIS WITHOUT CONTRAST TECHNIQUE: Multidetector CT imaging  of the abdomen and pelvis was performed following the standard protocol without IV contrast. COMPARISON:  07/12/2017 FINDINGS: LOWER CHEST: Normal. HEPATOBILIARY: Normal hepatic contours. No intra- or extrahepatic biliary dilatation. Unchanged hydropic gallbladder with multiple large stones. PANCREAS: Normal pancreas. No ductal dilatation or peripancreatic fluid collection. SPLEEN: Normal. ADRENALS/URINARY TRACT: The adrenal glands are normal. Right kidney is absent. No left nephroureterolithiasis or hydronephrosis the urinary bladder is normal for degree of distention STOMACH/BOWEL: There is no hiatal hernia. Normal duodenal course and caliber. No small bowel dilatation or inflammation. No focal colonic abnormality. Normal appendix. VASCULAR/LYMPHATIC: Normal course and caliber of the major abdominal vessels. No abdominal or pelvic lymphadenopathy. REPRODUCTIVE: There is a T-shaped contraceptive device within the uterus. MUSCULOSKELETAL. No bony spinal canal stenosis or focal osseous abnormality. OTHER: None. IMPRESSION: 1. No obstructive uropathy or nephroureterolithiasis. 2. Unchanged hydropic gallbladder with multiple large stones. 3. Absent right kidney. Electronically Signed   By: Ulyses Jarred M.D.   On: 05/30/2021 23:09    Procedures Procedures   Medications Ordered in ED Medications  ketorolac (TORADOL) 30 MG/ML injection 30 mg (30 mg Intravenous Given 05/30/21 2351)    ED Course  I have reviewed the triage vital signs and the nursing notes.  Pertinent labs & imaging results that were available during my care of the patient were reviewed by me and considered in my medical decision making (see chart for details).    MDM Rules/Calculators/A&P                         A well-appearing 35 year old female who presents the  emergency department with left lower back pain or flank pain.  She is afebrile and her vital signs are overall unremarkable.  Exam just notable for her left paraspinal left CVA tenderness.  Concern that patient may have kidney stone, pyelonephritis, or musculoskeletal spasm.  CMP with no electrolyte abnormalities or impaired kidney function.  No liver abnormalities.  CBC with no leukocytosis or anemia.  Urinalysis with no evidence of infection.  Pregnancy test is negative.  CT stone study with no evidence of kidney stones.  No evidence of appendicitis or other acute abdominal etiology.  Work-up was overall unrevealing.  Patient likely has musculoskeletal strain or muscle spasm in her left lower back.  She is instructed to follow-up with her primary care doctor if symptoms do not resolve.  Return to the emergency department if you develop worsening symptoms.   Final Clinical Impression(s) / ED Diagnoses Final diagnoses:  Acute left-sided low back pain without sciatica    Rx / DC Orders ED Discharge Orders     None        Adolphus Birchwood, PA-C 05/31/21 0009    Blanchie Dessert, MD 06/01/21 1338

## 2021-05-30 NOTE — Discharge Instructions (Signed)

## 2021-05-30 NOTE — ED Triage Notes (Signed)
Pain in low back radiating to lower abd for 2 days. States urine has been dark. Concerned for UTI

## 2021-05-30 NOTE — ED Notes (Signed)
Patient states has been having lower back pain since Friday. States difficulty with starting urine stream. Denies fever. ABCs intact, alert and oriented x4, respiration even and unlabored. Stretcher in low, locked position with call bell in reach, family in room, no needs at this time.

## 2024-02-13 ENCOUNTER — Other Ambulatory Visit: Payer: Self-pay

## 2024-02-13 ENCOUNTER — Ambulatory Visit
Admission: EM | Admit: 2024-02-13 | Discharge: 2024-02-13 | Disposition: A | Discharge status: Home / Self Care | Attending: Emergency Medicine | Admitting: Emergency Medicine

## 2024-02-13 DIAGNOSIS — R809 Proteinuria, unspecified: Secondary | ICD-10-CM | POA: Insufficient documentation

## 2024-02-13 DIAGNOSIS — R030 Elevated blood-pressure reading, without diagnosis of hypertension: Secondary | ICD-10-CM | POA: Diagnosis present

## 2024-02-13 DIAGNOSIS — Q600 Renal agenesis, unilateral: Secondary | ICD-10-CM | POA: Diagnosis not present

## 2024-02-13 DIAGNOSIS — R109 Unspecified abdominal pain: Secondary | ICD-10-CM | POA: Diagnosis not present

## 2024-02-13 LAB — POCT URINALYSIS DIP (MANUAL ENTRY)
Bilirubin, UA: NEGATIVE
Blood, UA: NEGATIVE
Glucose, UA: NEGATIVE mg/dL
Ketones, POC UA: NEGATIVE mg/dL
Leukocytes, UA: NEGATIVE
Nitrite, UA: NEGATIVE
Protein Ur, POC: 100 mg/dL — AB
Spec Grav, UA: 1.025 (ref 1.010–1.025)
Urobilinogen, UA: 0.2 U/dL
pH, UA: 6 (ref 5.0–8.0)

## 2024-02-13 LAB — POCT URINE PREGNANCY: Preg Test, Ur: NEGATIVE

## 2024-02-13 MED ORDER — BACLOFEN 10 MG PO TABS: 10.0000 mg | ORAL_TABLET | Freq: Three times a day (TID) | ORAL | 0 refills | Status: AC

## 2024-02-13 MED ORDER — CIPROFLOXACIN HCL 500 MG PO TABS: 500.0000 mg | ORAL_TABLET | Freq: Two times a day (BID) | ORAL | 0 refills | Status: AC

## 2024-02-13 NOTE — ED Triage Notes (Signed)
 C/O urinary frequency and urgency and left flank pain x 3 days. C/O chills and "cold sweats" Patient has not taken and home meds for symptoms.

## 2024-02-13 NOTE — Discharge Instructions (Addendum)
 The urinalysis that we performed in the clinic today was abnormal.  There was a significant amount of protein in your urine which may possibly be related to your blood pressure being elevated at this time.    Because you are having active symptoms of acute urinary tract infection, urine culture will be performed per our protocol.  The result of the urine culture will be available in the next 2 - 3 days and will be posted to your MyChart account.  If there is an abnormal finding, you will be contacted by phone and advised of further treatment recommendations, if any.   You were advised to begin antibiotics today because you are having active symptoms of an acute upper urinary tract infection also known as pyelonephritis.     Please pick up and begin taking your prescription for Ciprofloxacin 500 mg as soon as possible.  Please take all doses exactly as prescribed.  You can take this medication with or without food.  This medication is safe to take with your other medications.  If you receive a phone call advising you that your urine culture is negative but you begin to feel better after taking antibiotics for 24 hours, please feel free to complete the full course of antibiotics as they were likely needed and the urine culture result was false.  If your culture is negative and you do not feel any better, please return for repeat evaluation of other possible causes of your symptoms.   I have also provided you with a prescription for muscle relaxer called baclofen that you can take up to 3 times daily if taking ciprofloxacin does not immediately relieve your left-sided back pain.   Please consider abstaining from sexual intercourse while you are being treated for urinary tract infection.  Please follow up with your PCP to recheck your blood pressure in the next week or so. Because you only have one kidney, if you have hypertension it is best that you are diagnosed and treated as soon as possible.    If  you have not had complete resolution of your symptoms after completing treatment as prescribed, please return to urgent care for repeat evaluation or follow-up with your primary care provider.  Repeat urinalysis and urine culture may be indicated for more directed therapy.   Thank you for visiting [facility] today.  We appreciate the opportunity to participate in your care.

## 2024-02-13 NOTE — ED Provider Notes (Signed)
 [facility]    CSN: [id number] Arrival date & time: 02/13/24  1203    HISTORY   Chief Complaint  Patient presents with   Dysuria   HPI Lauren Mcmahon is a pleasant, 38 y.o. female who presents to urgent care today. Patient c/o increased frequency of urination,  as well as left flank pain, body aches, chills and "cold sweats" x 3 days.  Patient states has not taken and home meds for symptoms. 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  Pt has elevated BP on arrival, denies hx HTN, states mother has HTN but is unsure how old she was when she was dx.  Denies CP, SOB, fever, hematuria, vaginal d/c, vaginal pruritus, vaginal irritation, incontinence of urine, urine malodor, burning with urination.  Endorses hx pyelonephritis a few years ago.     The history is provided by the patient.  Dysuria   Past Medical History:  Diagnosis Date   Arthritis    deg disk disease - lower back   Depression    hx - no meds   Fibroid    Heartburn in pregnancy    tx with tums   PONV (postoperative nausea and vomiting)    Patient Active Problem List   Diagnosis Date Noted   Sepsis secondary to UTI ([facility]) 11/27/2018   Past Surgical History:  Procedure Laterality Date   CESAREAN SECTION  2009   x 1   CESAREAN SECTION  06/11/2012   Procedure: CESAREAN SECTION;  Surgeon: Adrianna Albee, MD;  Location: [facility];  Service: Obstetrics;  Laterality: N/A;  Repeat cesarean section with delivery of baby girl at 9. Apgars 8/9.   WISDOM TOOTH EXTRACTION     OB History     Gravida  2   Para  2   Term  2   Preterm  0   AB  0   Living  2      SAB  0   IAB  0   Ectopic  0   Multiple  0   Live Births  2          Home Medications    Prior to Admission medications   Medication Sig Start Date End Date Taking? Authorizing Provider  baclofen (LIORESAL) 10 MG tablet Take 1 tablet (10 mg total) by mouth 3 (three) times daily for 7 days. 02/13/24 02/20/24 Yes Eloise Hake Scales,  PA-C  ciprofloxacin (CIPRO) 500 MG tablet Take 1 tablet (500 mg total) by mouth every 12 (twelve) hours for 7 days. 02/13/24 02/20/24 Yes Eloise Hake Scales, PA-C    Family History Family History  Problem Relation Age of Onset   Hypertension Mother    Cancer Maternal Grandmother    Heart disease Maternal Grandfather    Diabetes Paternal Grandfather    Hypertension Paternal Grandfather    Anesthesia problems Neg Hx    Social History Social History   Tobacco Use   Smoking status: Every Day    Current packs/day: 0.50    Average packs/day: 0.5 packs/day for 10.0 years (5.0 ttl pk-yrs)    Types: Cigarettes   Smokeless tobacco: Never  Vaping Use   Vaping status: Never Used  Substance Use Topics   Alcohol  use: Yes    Comment: rarely   Drug use: Yes    Types: Marijuana    Comment: 3-4 times a week   Allergies   Penicillins  Review of Systems Review of Systems  Genitourinary:  Positive for dysuria.   Pertinent findings revealed  after performing a 14 point review of systems has been noted in the history of present illness.  Physical Exam Vital Signs BP 124/79   Pulse 66   Temp 98.2 F (36.8 C) (Oral)   Resp 18   LMP  (LMP Unknown)   SpO2 99%   No data found.  Physical Exam Vitals and nursing note reviewed.  Constitutional:      General: She is not in acute distress.    Appearance: Normal appearance.  HENT:     Head: Normocephalic and atraumatic.  Eyes:     Pupils: Pupils are equal, round, and reactive to light.  Cardiovascular:     Rate and Rhythm: Normal rate and regular rhythm.  Pulmonary:     Effort: Pulmonary effort is normal.  Abdominal:     General: Abdomen is flat. Bowel sounds are normal. There is no distension.     Palpations: Abdomen is soft. There is no mass.     Tenderness: There is no abdominal tenderness. There is left CVA tenderness. There is no right CVA tenderness, guarding or rebound.     Hernia: No hernia is present.  Musculoskeletal:         General: Normal range of motion.     Cervical back: Normal range of motion and neck supple.  Skin:    General: Skin is warm and dry.  Neurological:     General: No focal deficit present.     Mental Status: She is alert and oriented to person, place, and time. Mental status is at baseline.  Psychiatric:        Mood and Affect: Mood normal.        Behavior: Behavior normal.        Thought Content: Thought content normal.        Judgment: Judgment normal.     Visual Acuity Right Eye Distance:   Left Eye Distance:   Bilateral Distance:    Right Eye Near:   Left Eye Near:    Bilateral Near:     UC Couse / Diagnostics / Procedures:     Radiology No results found.  Procedures Procedures (including critical care time) EKG  Pending results:  Labs Reviewed  POCT URINALYSIS DIP (MANUAL ENTRY) - Abnormal; Notable for the following components:      Result Value   Protein Ur, POC =100 (*)    All other components within normal limits  URINE CULTURE  POCT URINE PREGNANCY    Medications Ordered in UC: Medications - No data to display  UC Diagnoses / Final Clinical Impressions(s)   I have reviewed the triage vital signs and the nursing notes.  Pertinent labs & imaging results that were available during my care of the patient were reviewed by me and considered in my medical decision making (see chart for details).    Final diagnoses:  Acute left flank pain  Solitary kidney, congenital  Elevated blood pressure reading in office without diagnosis of hypertension  Proteinuria, unspecified type   Left sided flank pain is most likely musculoskeletal in etiology based on physical exam, where pt localizes pain and urine dip with protein but otherwise unremarkable.  Will send urine cx to confirm. Given pt's hx pyelo and solitary kidney, will treat pt empirically for possible pyelonephritis while awaiting urine cx results, adjust or discontinue abx as needed based on result of  culture.  Pt provided with muscle relaxer for left sided flank pain, should urine cx be negative.  Pt advised to  f/u with PCP regarding isolated elevated BP reading and further evaluation of proteinuria.     Please see discharge instructions below for details of plan of care as provided to patient. ED Prescriptions     Medication Sig Dispense Auth. Provider   ciprofloxacin (CIPRO) 500 MG tablet Take 1 tablet (500 mg total) by mouth every 12 (twelve) hours for 7 days. 14 tablet Eloise Hake Scales, PA-C   baclofen (LIORESAL) 10 MG tablet Take 1 tablet (10 mg total) by mouth 3 (three) times daily for 7 days. 21 tablet Eloise Hake Scales, PA-C      PDMP not reviewed this encounter.  Disposition Upon Discharge:  Condition: stable for discharge home  Patient presented with concern for an acute illness with associated systemic symptoms and significant discomfort requiring urgent management. In my opinion, this is a condition that a prudent lay person (someone who possesses an average knowledge of health and medicine) may potentially expect to result in complications if not addressed urgently such as respiratory distress, impairment of bodily function or dysfunction of bodily organs.   As such, the patient has been evaluated and assessed, work-up was performed and treatment was provided in alignment with urgent care protocols and evidence based medicine.  Patient/parent/caregiver has been advised that the patient may require follow up for further testing and/or treatment if the symptoms continue in spite of treatment, as clinically indicated and appropriate.  Routine symptom specific, illness specific and/or disease specific instructions were discussed with the patient and/or caregiver at length.  Prevention strategies for avoiding STD exposure were also discussed.  The patient will follow up with their current PCP if and as advised. If the patient does not currently have a PCP we will assist  them in obtaining one.   The patient may need specialty follow up if the symptoms continue, in spite of conservative treatment and management, for further workup, evaluation, consultation and treatment as clinically indicated and appropriate.  Patient/parent/caregiver verbalized understanding and agreement of plan as discussed.  All questions were addressed during visit.  Please see discharge instructions below for further details of plan.    Discharge Instructions      The urinalysis that we performed in the clinic today was abnormal.  There was a significant amount of protein in your urine which may possibly be related to your blood pressure being elevated at this time.    Because you are having active symptoms of acute urinary tract infection, urine culture will be performed per our protocol.  The result of the urine culture will be available in the next 2 - 3 days and will be posted to your MyChart account.  If there is an abnormal finding, you will be contacted by phone and advised of further treatment recommendations, if any.   You were advised to begin antibiotics today because you are having active symptoms of an acute upper urinary tract infection also known as pyelonephritis.     Please pick up and begin taking your prescription for Ciprofloxacin 500 mg as soon as possible.  Please take all doses exactly as prescribed.  You can take this medication with or without food.  This medication is safe to take with your other medications.  If you receive a phone call advising you that your urine culture is negative but you begin to feel better after taking antibiotics for 24 hours, please feel free to complete the full course of antibiotics as they were likely needed and the urine culture result was false.  If your culture is negative and you do not feel any better, please return for repeat evaluation of other possible causes of your symptoms.   I have also provided you with a prescription for  muscle relaxer called baclofen that you can take up to 3 times daily if taking ciprofloxacin does not immediately relieve your left-sided back pain.   Please consider abstaining from sexual intercourse while you are being treated for urinary tract infection.  Please follow up with your PCP to recheck your blood pressure in the next week or so. Because you only have one kidney, if you have hypertension it is best that you are diagnosed and treated as soon as possible.    If you have not had complete resolution of your symptoms after completing treatment as prescribed, please return to urgent care for repeat evaluation or follow-up with your primary care provider.  Repeat urinalysis and urine culture may be indicated for more directed therapy.   Thank you for visiting [facility] today.  We appreciate the opportunity to participate in your care.    This office note has been dictated using Teaching laboratory technician.  Unfortunately, this method of dictation can sometimes lead to typographical or grammatical errors.  I apologize for your inconvenience in advance if this occurs.  Please do not hesitate to reach out to me if clarification is needed.       Eloise Hake Scales, PA-C 02/13/24 1252

## 2024-02-15 LAB — URINE CULTURE: Culture: 20000 — AB

## 2024-02-16 ENCOUNTER — Ambulatory Visit: Payer: Self-pay | Admitting: Emergency Medicine

## 2024-02-16 NOTE — Progress Notes (Signed)
 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  Please advise her that her urine culture result was inconclusive, revealing multiple species but none readily identifiable as the underlying cause of a urinary tract infection.  If patient is still experiencing symptoms after being treated with an injection of Rocephin  and high dose of ciprofloxacin, please encourage her to return for repeat urinalysis and urine culture, this can be a nurse visit.  If patient has improvement of her symptoms, please advise her to finish the ciprofloxacin.  If patient has worsening symptoms, please advise patient to go to the emergency room where she may be more aggressively treated with IV antibiotics for possible repeat multidrug-resistant infection.

## 2024-04-26 ENCOUNTER — Ambulatory Visit
Admission: EM | Admit: 2024-04-26 | Discharge: 2024-04-26 | Disposition: A | Discharge status: Home / Self Care | Attending: Emergency Medicine | Admitting: Emergency Medicine

## 2024-04-26 DIAGNOSIS — M545 Low back pain, unspecified: Secondary | ICD-10-CM | POA: Diagnosis not present

## 2024-04-26 LAB — POCT URINE DIPSTICK
Bilirubin, UA: NEGATIVE
Blood, UA: NEGATIVE
Glucose, UA: NEGATIVE mg/dL
Ketones, POC UA: NEGATIVE mg/dL
Leukocytes, UA: NEGATIVE
Nitrite, UA: NEGATIVE
Spec Grav, UA: >=1.03 — AB (ref 1.010–1.025)
Urobilinogen, UA: 0.2 U/dL
pH, UA: 5.5 (ref 5.0–8.0)

## 2024-04-26 MED ORDER — IBUPROFEN 400 MG PO TABS: 400.0000 mg | ORAL_TABLET | Freq: Three times a day (TID) | ORAL | 0 refills | Status: AC | PRN

## 2024-04-26 MED ORDER — BACLOFEN 10 MG PO TABS: 10.0000 mg | ORAL_TABLET | Freq: Every day | ORAL | 0 refills | Status: AC

## 2024-04-26 MED ORDER — DICLOFENAC SODIUM 1 % EX GEL: 4.0000 g | Freq: Four times a day (QID) | CUTANEOUS | 2 refills | Status: AC

## 2024-04-26 MED ORDER — ACETAMINOPHEN 500 MG PO TABS: 1000.0000 mg | ORAL_TABLET | Freq: Three times a day (TID) | ORAL | 0 refills | Status: AC | PRN

## 2024-04-26 NOTE — Discharge Instructions (Signed)
 The mainstay of therapy for musculoskeletal pain is reduction of inflammation and relaxation of tension which is causing inflammation.  Keep in mind, pain always begets more pain.  To help you stay ahead of your pain and inflammation, I have provided the following regimen for you:   When you pick up your prescription from the pharmacy, please begin taking Tylenol  975 mg 3 times daily (every 8 hours).  Please know that It is safe to take a maximum 3000 mg of Tylenol  in a 24-hour period.  Please do not exceed this amount.  Tylenol  works best when taken on a scheduled basis.   This evening, please begin taking baclofen  10 mg.  This is a highly effective muscle relaxer and antispasmodic which should continue to provide you with relaxation of your tense muscles, allow you to sleep well and to keep your pain under control.   When you pick up your prescription from the pharmacy, please continue taking ibuprofen  400 mg 3 times daily.  Please keep in mind that it is always easier to treat a little bit of pain that is to treat a lot of pain.  I recommend that for the next several days, you take this medication on a scheduled basis.  After that, take it when you begin to feel the pain returning, do not wait until you are in a lot of pain.   During the day, please set aside time to apply ice to the affected area 4 times daily for 20 minutes each application.  This can be achieved by using a bag of frozen peas or corn, a Ziploc bag filled with ice and water, or Ziploc bag filled with half rubbing alcohol  and half Dawn dish detergent, frozen into a slush.  Please be careful not to apply ice directly to your skin, always place a soft cloth between you and the ice pack.  Over-the-counter products such as IcyHot and Biofreeze do not work nearly as well.   You are welcome to use topical anti-inflammatory creams such as Voltaren  gel, capsaicin or Aspercreme as recommended.  These medications are available over-the-counter,  please follow manufactures instructions for use.  As a courtesy, I provided you with a prescription for diclofenac  in the event that your insurance will pay for this.   Please avoid attempts to stretch or strengthen the affected area until you are feeling completely pain-free.  Attempts to do so will only prolong the healing process.   Thank you for visiting [facility] today.  We appreciate the opportunity to participate in your care.

## 2024-04-26 NOTE — ED Provider Notes (Signed)
 [facility]    CSN: [id number] Arrival date & time: 04/26/24  1244    HISTORY   Chief Complaint  Patient presents with   Flank Pain   HPI Lauren Mcmahon is a pleasant, 38 y.o. female who presents to urgent care today. Patient complains of acute onset of right lower back pain today.  Patient states she took ibuprofen  400 mg and has not had relief.  Patient states she has had muscle spasms in her back in the past.  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  Per EMR, patient has a history of left solitary kidney.  Urinalysis today was unremarkable.  Patient denies loss of bowel or bladder function, numbness or tingling of the lower extremities, pain radiating down either leg, recent lower back injury or recent fall, fever, body aches, chills.  The history is provided by the patient.  Flank Pain   Past Medical History:  Diagnosis Date   Arthritis    deg disk disease - lower back   Depression    hx - no meds   Fibroid    Heartburn in pregnancy    tx with tums   PONV (postoperative nausea and vomiting)    Patient Active Problem List   Diagnosis Date Noted   Sepsis secondary to UTI (HCC) 11/27/2018   Past Surgical History:  Procedure Laterality Date   CESAREAN SECTION  2009   x 1   CESAREAN SECTION  06/11/2012   Procedure: CESAREAN SECTION;  Surgeon: Nathanel LELON Bunker, MD;  Location: [facility];  Service: Obstetrics;  Laterality: N/A;  Repeat cesarean section with delivery of baby girl at 13. Apgars 8/9.   WISDOM TOOTH EXTRACTION     OB History     Gravida  2   Para  2   Term  2   Preterm  0   AB  0   Living  2      SAB  0   IAB  0   Ectopic  0   Multiple  0   Live Births  2          Home Medications    Prior to Admission medications   Medication Sig Start Date End Date Taking? Authorizing Provider  benzonatate (TESSALON) 200 MG capsule Take 200 mg by mouth 3 (three) times daily as needed. 04/18/24  Yes  [provider]  VENTOLIN HFA 108 (90 Base) MCG/ACT inhaler SMARTSIG:2 Puff(s) By Mouth Every 4 Hours PRN 04/18/24  Yes [provider]    Family History Family History  Problem Relation Age of Onset   Hypertension Mother    Cancer Maternal Grandmother    Heart disease Maternal Grandfather    Diabetes Paternal Grandfather    Hypertension Paternal Grandfather    Anesthesia problems Neg Hx    Social History Social History   Tobacco Use   Smoking status: Every Day    Current packs/day: 0.50    Average packs/day: 0.5 packs/day for 10.0 years (5.0 ttl pk-yrs)    Types: Cigarettes    Passive exposure: Current   Smokeless tobacco: Never  Vaping Use   Vaping status: Never Used  Substance Use Topics   Alcohol  use: Yes    Comment: rarely   Drug use: Yes    Types: Marijuana    Comment: 3-4 times a week   Allergies   Penicillins  Review of Systems Review of Systems  Genitourinary:  Positive for flank pain.  Pertinent findings revealed after performing a 14 point review of systems has been noted in the history of present illness.  Physical Exam Vital Signs BP 129/74 (BP Location: Right Arm)   Pulse (!) 55   Temp 98.4 F (36.9 C) (Oral)   Resp 15   SpO2 97%   No data found.  Physical Exam Vitals and nursing note reviewed.  Constitutional:      General: She is awake. She is not in acute distress.    Appearance: Normal appearance. She is well-developed and well-groomed.  Musculoskeletal:     Lumbar back: Spasms and tenderness present.  Neurological:     Mental Status: She is alert.  Psychiatric:        Behavior: Behavior is cooperative.     Visual Acuity Right Eye Distance:   Left Eye Distance:   Bilateral Distance:    Right Eye Near:   Left Eye Near:    Bilateral Near:     UC Couse / Diagnostics / Procedures:     Radiology No results found.  Procedures Procedures (including critical care time) EKG  Pending results:  Labs  Reviewed  POCT URINE DIPSTICK - Abnormal; Notable for the following components:      Result Value   Spec Grav, UA >=1.030 (*)    All other components within normal limits    Medications Ordered in UC: Medications - No data to display  UC Diagnoses / Final Clinical Impressions(s)   I have reviewed the triage vital signs and the nursing notes.  Pertinent labs & imaging results that were available during my care of the patient were reviewed by me and considered in my medical decision making (see chart for details).    Final diagnoses:  Acute right-sided low back pain without sciatica   Patient advised to continue ibuprofen  400 mg and to add acetaminophen  1000 mg 3 times daily and baclofen  10 mg nightly.  Also provided patient with a prescription for diclofenac  and I have advised patient to apply ice to lower back for 20 minutes 4 times daily.  Conservative care recommended.  Return precautions advised.  Please see discharge instructions below for details of plan of care as provided to patient. ED Prescriptions     Medication Sig Dispense Auth. Provider   baclofen  (LIORESAL ) 10 MG tablet Take 1 tablet (10 mg total) by mouth at bedtime for 7 days. 7 tablet Joesph Shaver Scales, PA-C   ibuprofen  (ADVIL ) 400 MG tablet Take 1 tablet (400 mg total) by mouth every 8 (eight) hours as needed for up to 30 doses. 30 tablet Joesph Shaver Scales, PA-C   acetaminophen  (TYLENOL ) 500 MG tablet Take 2 tablets (1,000 mg total) by mouth every 8 (eight) hours as needed for up to 30 doses for mild pain (pain score 1-3) or fever. 60 tablet Joesph Shaver Scales, PA-C   diclofenac  Sodium (VOLTAREN ) 1 % GEL Apply 4 g topically 4 (four) times daily. Apply to affected areas 4 times daily as needed for pain. 100 g Joesph Shaver Scales, PA-C      PDMP not reviewed this encounter.  Pending results:  Labs Reviewed  POCT URINE DIPSTICK - Abnormal; Notable for the following components:      Result Value    Spec Grav, UA >=1.030 (*)    All other components within normal limits      Discharge Instructions      The mainstay of therapy for musculoskeletal pain is reduction of inflammation and relaxation of tension which  is causing inflammation.  Keep in mind, pain always begets more pain.  To help you stay ahead of your pain and inflammation, I have provided the following regimen for you:   When you pick up your prescription from the pharmacy, please begin taking Tylenol  975 mg 3 times daily (every 8 hours).  Please know that It is safe to take a maximum 3000 mg of Tylenol  in a 24-hour period.  Please do not exceed this amount.  Tylenol  works best when taken on a scheduled basis.   This evening, please begin taking baclofen  10 mg.  This is a highly effective muscle relaxer and antispasmodic which should continue to provide you with relaxation of your tense muscles, allow you to sleep well and to keep your pain under control.   When you pick up your prescription from the pharmacy, please continue taking ibuprofen  400 mg 3 times daily.  Please keep in mind that it is always easier to treat a little bit of pain that is to treat a lot of pain.  I recommend that for the next several days, you take this medication on a scheduled basis.  After that, take it when you begin to feel the pain returning, do not wait until you are in a lot of pain.   During the day, please set aside time to apply ice to the affected area 4 times daily for 20 minutes each application.  This can be achieved by using a bag of frozen peas or corn, a Ziploc bag filled with ice and water, or Ziploc bag filled with half rubbing alcohol  and half Dawn dish detergent, frozen into a slush.  Please be careful not to apply ice directly to your skin, always place a soft cloth between you and the ice pack.  Over-the-counter products such as IcyHot and Biofreeze do not work nearly as well.   You are welcome to use topical anti-inflammatory creams  such as Voltaren  gel, capsaicin or Aspercreme as recommended.  These medications are available over-the-counter, please follow manufactures instructions for use.  As a courtesy, I provided you with a prescription for diclofenac  in the event that your insurance will pay for this.   Please avoid attempts to stretch or strengthen the affected area until you are feeling completely pain-free.  Attempts to do so will only prolong the healing process.   Thank you for visiting [facility] today.  We appreciate the opportunity to participate in your care.       Disposition Upon Discharge:  Condition: stable for discharge home  Patient presented with an acute illness with associated systemic symptoms and significant discomfort requiring urgent management. In my opinion, this is a condition that a prudent lay person (someone who possesses an average knowledge of health and medicine) may potentially expect to result in complications if not addressed urgently such as respiratory distress, impairment of bodily function or dysfunction of bodily organs.   Routine symptom specific, illness specific and/or disease specific instructions were discussed with the patient and/or caregiver at length.   As such, the patient has been evaluated and assessed, work-up was performed and treatment was provided in alignment with urgent care protocols and evidence based medicine.  Patient/parent/caregiver has been advised that the patient may require follow up for further testing and treatment if the symptoms continue in spite of treatment, as clinically indicated and appropriate.  Patient/parent/caregiver has been advised to return to the [facility] or PCP if no better; to PCP or the Emergency  Department if new signs and symptoms develop, or if the current signs or symptoms continue to change or worsen for further workup, evaluation and treatment as clinically indicated and appropriate  The patient will follow up with  their current PCP if and as advised. If the patient does not currently have a PCP we will assist them in obtaining one.   The patient may need specialty follow up if the symptoms continue, in spite of conservative treatment and management, for further workup, evaluation, consultation and treatment as clinically indicated and appropriate.  Patient/parent/caregiver verbalized understanding and agreement of plan as discussed.  All questions were addressed during visit.  Please see discharge instructions below for further details of plan.  This office note has been dictated using Teaching laboratory technician.  Unfortunately, this method of dictation can sometimes lead to typographical or grammatical errors.  I apologize for your inconvenience in advance if this occurs.  Please do not hesitate to reach out to me if clarification is needed.      Joesph Shaver Scales, PA-C 04/26/24 1404

## 2024-04-26 NOTE — ED Triage Notes (Signed)
 Pt c/o right lower back pain that started today.   Home Interventions: Ibuprofen , without relief
